# Patient Record
Sex: Female | Born: 1990 | Race: Black or African American | Hispanic: Refuse to answer | Marital: Single
Health system: Midwestern US, Community
[De-identification: ages and names within clinical notes are randomized; demographics above are authoritative.]

## PROBLEM LIST (undated history)

## (undated) ENCOUNTER — Inpatient Hospital Stay (HOSPITAL_COMMUNITY): Payer: Self-pay

## (undated) DIAGNOSIS — N83209 Unspecified ovarian cyst, unspecified side: Secondary | ICD-10-CM

## (undated) DIAGNOSIS — A749 Chlamydial infection, unspecified: Secondary | ICD-10-CM

## (undated) DIAGNOSIS — F419 Anxiety disorder, unspecified: Secondary | ICD-10-CM

## (undated) DIAGNOSIS — K219 Gastro-esophageal reflux disease without esophagitis: Secondary | ICD-10-CM

## (undated) HISTORY — DX: Chlamydial infection, unspecified: A74.9

## (undated) HISTORY — PX: DILATION AND CURETTAGE OF UTERUS: SHX78

## (undated) HISTORY — PX: INDUCED ABORTION: SHX677

## (undated) HISTORY — DX: Gastro-esophageal reflux disease without esophagitis: K21.9

---
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
## 2010-01-06 DIAGNOSIS — A749 Chlamydial infection, unspecified: Secondary | ICD-10-CM

## 2010-01-06 HISTORY — DX: Chlamydial infection, unspecified: A74.9

## 2010-01-15 ENCOUNTER — Emergency Department (HOSPITAL_COMMUNITY)
Admission: EM | Admit: 2010-01-15 | Discharge: 2010-01-15 | Payer: Self-pay | Source: Home / Self Care | Admitting: Family Medicine

## 2010-04-19 LAB — URINE CULTURE
Colony Count: 15000
Culture  Setup Time: 201112102038

## 2010-04-19 LAB — POCT URINALYSIS DIPSTICK
Bilirubin Urine: NEGATIVE
Glucose, UA: NEGATIVE mg/dL
Specific Gravity, Urine: >=1.03 (ref 1.005–1.030)

## 2010-04-19 LAB — GC/CHLAMYDIA PROBE AMP, GENITAL: Chlamydia, DNA Probe: POSITIVE — AB

## 2010-04-19 LAB — WET PREP, GENITAL

## 2010-04-28 ENCOUNTER — Emergency Department (HOSPITAL_COMMUNITY): Payer: Managed Care, Other (non HMO)

## 2010-04-28 ENCOUNTER — Emergency Department (HOSPITAL_COMMUNITY)
Admission: EM | Admit: 2010-04-28 | Discharge: 2010-04-28 | Disposition: A | Discharge status: Home / Self Care | Payer: Managed Care, Other (non HMO) | Attending: Emergency Medicine | Admitting: Emergency Medicine

## 2010-04-28 DIAGNOSIS — R0609 Other forms of dyspnea: Secondary | ICD-10-CM | POA: Insufficient documentation

## 2010-04-28 DIAGNOSIS — R0989 Other specified symptoms and signs involving the circulatory and respiratory systems: Secondary | ICD-10-CM | POA: Insufficient documentation

## 2010-04-28 DIAGNOSIS — R079 Chest pain, unspecified: Secondary | ICD-10-CM | POA: Insufficient documentation

## 2010-04-28 DIAGNOSIS — J45909 Unspecified asthma, uncomplicated: Secondary | ICD-10-CM | POA: Insufficient documentation

## 2010-04-28 LAB — URINALYSIS, ROUTINE W REFLEX MICROSCOPIC
Bilirubin Urine: NEGATIVE
Nitrite: NEGATIVE
Specific Gravity, Urine: 1.018 (ref 1.005–1.030)
Urobilinogen, UA: 1 mg/dL (ref 0.0–1.0)

## 2010-04-28 LAB — POCT I-STAT, CHEM 8
Glucose, Bld: 75 mg/dL (ref 70–99)
HCT: 44 % (ref 36.0–46.0)
Hemoglobin: 15 g/dL (ref 12.0–15.0)
Potassium: 3.9 mEq/L (ref 3.5–5.1)

## 2010-04-28 LAB — POCT PREGNANCY, URINE: Preg Test, Ur: NEGATIVE

## 2010-06-01 ENCOUNTER — Emergency Department (HOSPITAL_COMMUNITY): Admission: EM | Admit: 2010-06-01 | Payer: Managed Care, Other (non HMO) | Source: Home / Self Care

## 2010-06-01 ENCOUNTER — Inpatient Hospital Stay (INDEPENDENT_AMBULATORY_CARE_PROVIDER_SITE_OTHER)
Admission: RE | Admit: 2010-06-01 | Discharge: 2010-06-01 | Disposition: A | Discharge status: Home / Self Care | Payer: Managed Care, Other (non HMO) | Source: Ambulatory Visit | Attending: Family Medicine | Admitting: Family Medicine

## 2010-06-01 DIAGNOSIS — K219 Gastro-esophageal reflux disease without esophagitis: Secondary | ICD-10-CM

## 2010-08-26 ENCOUNTER — Encounter: Payer: Self-pay | Admitting: Internal Medicine

## 2010-09-19 ENCOUNTER — Ambulatory Visit: Payer: Managed Care, Other (non HMO) | Admitting: Internal Medicine

## 2010-09-19 ENCOUNTER — Telehealth: Payer: Self-pay | Admitting: Internal Medicine

## 2010-09-19 NOTE — Telephone Encounter (Signed)
No dont charge thanks 

## 2010-10-17 ENCOUNTER — Ambulatory Visit: Payer: Managed Care, Other (non HMO) | Admitting: Internal Medicine

## 2010-12-07 ENCOUNTER — Inpatient Hospital Stay (INDEPENDENT_AMBULATORY_CARE_PROVIDER_SITE_OTHER)
Admission: RE | Admit: 2010-12-07 | Discharge: 2010-12-07 | Disposition: A | Discharge status: Home / Self Care | Payer: Managed Care, Other (non HMO) | Source: Ambulatory Visit | Attending: Emergency Medicine | Admitting: Emergency Medicine

## 2010-12-07 DIAGNOSIS — N760 Acute vaginitis: Secondary | ICD-10-CM

## 2010-12-07 LAB — WET PREP, GENITAL

## 2010-12-07 LAB — POCT URINALYSIS DIP (DEVICE)
Ketones, ur: NEGATIVE mg/dL
Leukocytes, UA: NEGATIVE
Nitrite: NEGATIVE
Protein, ur: NEGATIVE mg/dL
Urobilinogen, UA: 1 mg/dL (ref 0.0–1.0)

## 2010-12-07 LAB — POCT PREGNANCY, URINE: Preg Test, Ur: NEGATIVE

## 2011-01-04 ENCOUNTER — Emergency Department (INDEPENDENT_AMBULATORY_CARE_PROVIDER_SITE_OTHER)
Admission: EM | Admit: 2011-01-04 | Discharge: 2011-01-04 | Disposition: A | Discharge status: Home / Self Care | Payer: Managed Care, Other (non HMO) | Source: Home / Self Care | Attending: Emergency Medicine | Admitting: Emergency Medicine

## 2011-01-04 ENCOUNTER — Encounter (HOSPITAL_COMMUNITY): Payer: Self-pay | Admitting: Emergency Medicine

## 2011-01-04 DIAGNOSIS — A6000 Herpesviral infection of urogenital system, unspecified: Secondary | ICD-10-CM

## 2011-01-04 LAB — WET PREP, GENITAL
Trich, Wet Prep: NONE SEEN
Yeast Wet Prep HPF POC: NONE SEEN

## 2011-01-04 MED ORDER — LIDOCAINE 4 % EX GEL: 1.0000 "application " | Freq: Four times a day (QID) | CUTANEOUS | Status: DC | PRN

## 2011-01-04 MED ORDER — ACYCLOVIR 400 MG PO TABS: 400.0000 mg | ORAL_TABLET | Freq: Three times a day (TID) | ORAL | Status: AC

## 2011-01-04 MED ORDER — LIDOCAINE VISCOUS 2 % MT SOLN
20.0000 mL | Freq: Once | OROMUCOSAL | Status: AC
  Administered 2011-01-04: 20 mL via OROMUCOSAL

## 2011-01-04 NOTE — Discharge Instructions (Signed)
Take the medication as written. Give [place] a working phone number so that we can contact you if needed. Refrain from sexual contact until you know your results and your partner(s) are treated. Return if you get worse, have a fever >100.4, or for any concerns.

## 2011-01-04 NOTE — ED Provider Notes (Signed)
History     CSN: [id number] Arrival date & time: 01/04/2011  1:39 PM   First MD Initiated Contact with Patient 01/04/11 1309      Chief Complaint  Patient presents with  . Abdominal Pain    HPI Comments: Pt with labial itching, pain x 2 days. Has dysuria.  No urgency, frequency, oderous urine, hematuria, known genital blisters, vaginal itching. No known d/c. Just finished menses. Reports intermittent lwer midline abd pain c/w menstrual cramps. Taking motrin for abd pain with some relief.  No fevers, N/V,  back pain. Pt seen here with vag d/, abd pain c on 10/31 and empirically tx'd for gonorrhea, chlamydia and just finished flagyl for BV. GC chlaymdia were neg on that visit. Pt sexually active with same female partner who is asxatic. Recently had intercouse with same partner again did not use protection. STD's not a concern today.  No h/o syphilis, herpes, HIV.   Patient is a 20 y.o. female presenting with vaginal itching. The history is provided by the patient.  Vaginal Itching This is a new problem. The current episode started 2 days ago. The problem occurs constantly. Pertinent negatives include no chest pain and no abdominal pain. The symptoms are relieved by nothing. She has tried nothing for the symptoms.    Past Medical History  Diagnosis Date  . Asthma   . GERD (gastroesophageal reflux disease)   . Chlamydia 01/2010  . BV (bacterial vaginosis)     History reviewed. No pertinent past surgical history.  History reviewed. No pertinent family history.  History  Substance Use Topics  . Smoking status: Never Smoker   . Smokeless tobacco: Not on file  . Alcohol Use: No    OB History    Grav Para Term Preterm Abortions TAB SAB Ect Mult Living                  Review of Systems  Constitutional: Negative for fever.  HENT: Negative for sore throat and mouth sores.   Respiratory: Negative for cough.   Cardiovascular: Negative for chest pain.  Gastrointestinal: Negative  for abdominal pain.  Genitourinary: Positive for vaginal bleeding, vaginal discharge and vaginal pain. Negative for dysuria, urgency, frequency, hematuria and flank pain.  Musculoskeletal: Negative for back pain.  Skin: Negative for rash.    Allergies  Review of patient's allergies indicates no known allergies.  Home Medications   Current Outpatient Rx  Name Route Sig Dispense Refill  . ACYCLOVIR 400 MG PO TABS Oral Take 1 tablet (400 mg total) by mouth 3 (three) times daily. 30 tablet 0  . LIDOCAINE 4 % EX GEL Apply externally Apply 1 application topically 4 (four) times daily as needed. Do not exceed 5 gm 10 g 0    BP 109/68  Pulse 78  Temp(Src) 100 F (37.8 C) (Oral)  Resp 18  SpO2 100%  LMP 01/01/2011  Physical Exam  Nursing note and vitals reviewed. Constitutional: She is oriented to person, place, and time. She appears well-developed and well-nourished. No distress.  HENT:  Head: Normocephalic and atraumatic.  Eyes: EOM are normal. Pupils are equal, round, and reactive to light.  Neck: Normal range of motion. Neck supple.  Cardiovascular: Normal rate, regular rhythm and normal heart sounds.   Pulmonary/Chest: Effort normal and breath sounds normal.  Abdominal: Soft. Bowel sounds are normal. She exhibits no distension. There is no tenderness. There is no rebound and no guarding.  Genitourinary: Uterus normal. Pelvic exam was performed with patient  prone. There is rash on the right labia. There is no rash on the left labia. Uterus is not tender. Cervix exhibits discharge. Cervix exhibits no motion tenderness and no friability. Right adnexum displays no mass, no tenderness and no fullness. Left adnexum displays no mass, no tenderness and no fullness. No erythema around the vagina. No foreign body around the vagina.       Painful vesicular rash right labia. painful vesicular lesions on vaginal walls. Cervix normal. Scant bleeding from os. No discharge noted. Chaperone present  during exam.  Musculoskeletal: Normal range of motion.  Neurological: She is alert and oriented to person, place, and time.  Skin: Skin is warm and dry.  Psychiatric: She has a normal mood and affect. Her behavior is normal. Judgment and thought content normal.    ED Course  Procedures (including critical care time)  Labs Reviewed  WET PREP, GENITAL - Abnormal; Notable for the following:    Clue Cells, Wet Prep FEW (*)    WBC, Wet Prep HPF POC MANY (*)    All other components within normal limits  POCT PREGNANCY, URINE  GC/CHLAMYDIA PROBE AMP, GENITAL  HERPES SIMPLEX VIRUS CULTURE   No results found.   1. Herpes genitalis    udip not crossing overL Tr ketone,mod  blood, small leuk. Pt with vaginal bleeding  Results for orders placed during the hospital encounter of 01/04/11  WET PREP, GENITAL      Component Value Range   Yeast, Wet Prep NONE SEEN  NONE SEEN    Trich, Wet Prep NONE SEEN  NONE SEEN    Clue Cells, Wet Prep FEW (*) NONE SEEN    WBC, Wet Prep HPF POC MANY (*) NONE SEEN   POCT PREGNANCY, URINE      Component Value Range   Preg Test, Ur NEGATIVE       MDM  H&P most c/w herpes. Sent off GC/chlamydia, wet prep HSV culture. Will not treat empirically now. Will send home with acyclovir.  Advised pt to refrain from sexual contact until she knows lab results, symptoms resolve, and partner(s) are treated. Pt provided working phone number. Advised to call here in 5 days if pt does not hear from [place]. Pt agrees.     Luiz Blare, MD 01/04/11 [phone]

## 2011-01-05 ENCOUNTER — Encounter (HOSPITAL_COMMUNITY): Payer: Self-pay | Admitting: Emergency Medicine

## 2011-01-05 ENCOUNTER — Emergency Department (HOSPITAL_COMMUNITY)
Admission: EM | Admit: 2011-01-05 | Discharge: 2011-01-06 | Disposition: A | Discharge status: Home / Self Care | Payer: Managed Care, Other (non HMO) | Attending: Emergency Medicine | Admitting: Emergency Medicine

## 2011-01-05 DIAGNOSIS — A6000 Herpesviral infection of urogenital system, unspecified: Secondary | ICD-10-CM | POA: Insufficient documentation

## 2011-01-05 DIAGNOSIS — N949 Unspecified condition associated with female genital organs and menstrual cycle: Secondary | ICD-10-CM | POA: Insufficient documentation

## 2011-01-05 DIAGNOSIS — J45909 Unspecified asthma, uncomplicated: Secondary | ICD-10-CM | POA: Insufficient documentation

## 2011-01-05 DIAGNOSIS — K219 Gastro-esophageal reflux disease without esophagitis: Secondary | ICD-10-CM | POA: Insufficient documentation

## 2011-01-05 DIAGNOSIS — A6009 Herpesviral infection of other urogenital tract: Secondary | ICD-10-CM

## 2011-01-05 LAB — GC/CHLAMYDIA PROBE AMP, GENITAL: GC Probe Amp, Genital: NEGATIVE

## 2011-01-05 NOTE — ED Notes (Signed)
PT. REPORTS VAGINAL PAIN " BLISTERS" ONSET 2 DAYS AGO , DENIES VAGINAL DISCHARGE ,  NO FEVER OR CHILLS .

## 2011-01-06 MED ORDER — HYDROCODONE-ACETAMINOPHEN 5-325 MG PO TABS: ORAL_TABLET | ORAL | Status: DC

## 2011-01-06 MED ORDER — HYDROCODONE-ACETAMINOPHEN 5-325 MG PO TABS
1.0000 | ORAL_TABLET | Freq: Once | ORAL | Status: AC
  Administered 2011-01-06: 1 via ORAL
  Filled 2011-01-06: qty 1

## 2011-01-06 NOTE — ED Provider Notes (Signed)
History     CSN: [id number] Arrival date & time: 01/05/2011  6:50 PM   First MD Initiated Contact with Patient 01/05/11 2348      Chief Complaint  Patient presents with  . Vaginal Pain    (Consider location/radiation/quality/duration/timing/severity/associated sxs/prior treatment) Patient is a 20 y.o. female presenting with vaginal pain. The history is provided by the patient.  Vaginal Pain This is a new problem. The current episode started yesterday. The problem occurs constantly. The problem has been unchanged. Pertinent negatives include no chills or fever. Associated symptoms comments: She complains of painful vaginal blisters x 2 days. She was seen at Urgent care 2 days ago and given Acyclovir and topical lidocaine for pain. She is here for further pain management. No new symptoms..    Past Medical History  Diagnosis Date  . Asthma   . GERD (gastroesophageal reflux disease)   . Chlamydia 01/2010  . BV (bacterial vaginosis)     History reviewed. No pertinent past surgical history.  No family history on file.  History  Substance Use Topics  . Smoking status: Never Smoker   . Smokeless tobacco: Not on file  . Alcohol Use: No    OB History    Grav Para Term Preterm Abortions TAB SAB Ect Mult Living                  Review of Systems  Constitutional: Negative for fever and chills.  Gastrointestinal: Negative.   Genitourinary: Positive for vaginal pain. Negative for vaginal discharge.  Musculoskeletal: Negative.   Skin: Negative.   Neurological: Negative.     Allergies  Review of patient's allergies indicates no known allergies.  Home Medications   Current Outpatient Rx  Name Route Sig Dispense Refill  . ACYCLOVIR 400 MG PO TABS Oral Take 1 tablet (400 mg total) by mouth 3 (three) times daily. 30 tablet 0  . IBUPROFEN 200 MG PO TABS Oral Take 400 mg by mouth every 6 (six) hours as needed. For  pain     . LIDOCAINE 4 % EX GEL Apply externally Apply 1  application topically 4 (four) times daily as needed. Do not exceed 5 gm; patient unable to find at any pharmacy in [place], so only has used what the Dr. Jovita Gamma at office.       BP 109/70  Pulse 93  Temp(Src) 99.1 F (37.3 C) (Oral)  Resp 16  SpO2 100%  LMP 01/01/2011  Physical Exam  Constitutional: She is oriented to person, place, and time. She appears well-developed and well-nourished.  Neck: Normal range of motion.  Pulmonary/Chest: Effort normal.  Abdominal: Soft. There is no tenderness.  Genitourinary:       Multiple vaginal blisters to external labia as well as vulva c/w herpetic infection.   Neurological: She is alert and oriented to person, place, and time.  Skin: Skin is warm and dry.    ED Course  Procedures (including critical care time)  Labs Reviewed - No data to display No results found.   No diagnosis found.    MDM          Rodena Medin, PA 01/06/11 [phone]

## 2011-01-06 NOTE — ED Provider Notes (Signed)
Medical screening examination/treatment/procedure(s) were performed by non-physician practitioner and as supervising physician I was immediately available for consultation/collaboration.   Kasmira Cacioppo M Analysa Nutting, MD 01/06/11 0904 

## 2011-01-09 ENCOUNTER — Telehealth (HOSPITAL_COMMUNITY): Payer: Self-pay | Admitting: *Deleted

## 2011-01-09 NOTE — ED Notes (Signed)
1731  Discussed with Dr. Chaney Malling and order obtained for Flagyl 500 mg. PO BID x 7 days # 14  no refills if pt. has discharge or sx.'s of BV. Vassie Moselle 01/09/2011

## 2011-01-12 ENCOUNTER — Telehealth (HOSPITAL_COMMUNITY): Payer: Self-pay | Admitting: *Deleted

## 2011-01-12 NOTE — ED Notes (Signed)
12/3 1731 Written order obtained from Dr. Chaney Malling for Flagyl 500 mg. PO BID x 7 days # 14 no refills.  She said tx. pt. if symptomatic.  I called pt. and she said no symptoms of discharge, just tingling in perineum.  I told her if she has a discharge after she has finished to the Acyclovir to call back. 12/5 Pt. called on voicemail @ 1401 and said she needs the medicine. I called back at 1453 and left a message to call. 12/6 I called pt. back and pt. states she is having a yellowish discharge with an odor and had to wear a pad today. Pt. wants Rx. called to [facility]. Rx. called to pharmacist @ [phone]. Angela Riley 01/12/2011

## 2011-01-17 ENCOUNTER — Telehealth (HOSPITAL_COMMUNITY): Payer: Self-pay | Admitting: *Deleted

## 2011-02-18 ENCOUNTER — Encounter (HOSPITAL_COMMUNITY): Payer: Self-pay | Admitting: *Deleted

## 2011-02-18 ENCOUNTER — Emergency Department (INDEPENDENT_AMBULATORY_CARE_PROVIDER_SITE_OTHER)
Admission: EM | Admit: 2011-02-18 | Discharge: 2011-02-18 | Disposition: A | Discharge status: Home / Self Care | Payer: Managed Care, Other (non HMO) | Source: Home / Self Care | Attending: Family Medicine | Admitting: Family Medicine

## 2011-02-18 DIAGNOSIS — A6000 Herpesviral infection of urogenital system, unspecified: Secondary | ICD-10-CM

## 2011-02-18 DIAGNOSIS — N139 Obstructive and reflux uropathy, unspecified: Secondary | ICD-10-CM

## 2011-02-18 LAB — POCT URINALYSIS DIP (DEVICE)
Hgb urine dipstick: NEGATIVE
Nitrite: NEGATIVE
Protein, ur: NEGATIVE mg/dL
pH: 6 (ref 5.0–8.0)

## 2011-02-18 MED ORDER — ACYCLOVIR 400 MG PO TABS: 400.0000 mg | ORAL_TABLET | Freq: Four times a day (QID) | ORAL | Status: AC

## 2011-02-18 MED ORDER — CEPHALEXIN 500 MG PO CAPS: 500.0000 mg | ORAL_CAPSULE | Freq: Four times a day (QID) | ORAL | Status: AC

## 2011-02-18 NOTE — ED Notes (Signed)
Pt with symptoms of vaginal discharge/urinary frequency same symptoms seen in December - pt states she took all of the medications until gone - had improved now symptoms returned

## 2011-02-18 NOTE — ED Provider Notes (Signed)
History     CSN: [id number]  Arrival date & time 02/18/11  1825   First MD Initiated Contact with Patient 02/18/11 1826      Chief Complaint  Patient presents with  . Urinary Frequency  . Vaginal Discharge    (Consider location/radiation/quality/duration/timing/severity/associated sxs/prior treatment) Patient is a 21 y.o. female presenting with frequency. The history is provided by the patient.  Urinary Frequency This is a recurrent (seen 11/28 for genital herpes feels like bump starting again and urinary sx also.) problem. The problem has been gradually worsening. Pertinent negatives include no abdominal pain.    Past Medical History  Diagnosis Date  . Asthma   . GERD (gastroesophageal reflux disease)   . Chlamydia 01/2010  . BV (bacterial vaginosis)     History reviewed. No pertinent past surgical history.  History reviewed. No pertinent family history.  History  Substance Use Topics  . Smoking status: Never Smoker   . Smokeless tobacco: Not on file  . Alcohol Use: No    OB History    Grav Para Term Preterm Abortions TAB SAB Ect Mult Living                  Review of Systems  Constitutional: Negative.   Gastrointestinal: Negative.  Negative for abdominal pain.  Genitourinary: Positive for dysuria, frequency and vaginal pain. Negative for vaginal bleeding and vaginal discharge.    Allergies  Review of patient's allergies indicates no known allergies.  Home Medications   Current Outpatient Rx  Name Route Sig Dispense Refill  . IBUPROFEN 200 MG PO TABS Oral Take 400 mg by mouth every 6 (six) hours as needed. For  pain    . ACYCLOVIR 400 MG PO TABS Oral Take 1 tablet (400 mg total) by mouth 4 (four) times daily. For 5 days,  Repeat as needed as soon as recurrent blisters develop. 50 tablet 0  . CEPHALEXIN 500 MG PO CAPS Oral Take 1 capsule (500 mg total) by mouth 4 (four) times daily. Take all of medicine and drink lots of fluids 20 capsule 0  .  HYDROCODONE-ACETAMINOPHEN 5-325 MG PO TABS  One to two tablets q 4-6 hours prn 15 tablet 0  . LIDOCAINE 4 % EX GEL Apply externally Apply 1 application topically 4 (four) times daily as needed. Do not exceed 5 gm; patient unable to find at any pharmacy in [place], so only has used what the Dr. Jovita Gamma at office.       BP 121/70  Pulse 75  Temp(Src) 98.8 F (37.1 C) (Oral)  Resp 16  SpO2 100%  LMP 01/16/2011  Physical Exam  Nursing note and vitals reviewed. Constitutional: She is oriented to person, place, and time. She appears well-developed and well-nourished.  Abdominal: Soft. Bowel sounds are normal. She exhibits no distension and no mass. There is no tenderness. There is no rebound and no guarding.  Neurological: She is alert and oriented to person, place, and time.    ED Course  Procedures (including critical care time)  Labs Reviewed  POCT URINALYSIS DIP (DEVICE) - Abnormal; Notable for the following:    Leukocytes, UA MODERATE (*) Biochemical Testing Only. Please order routine urinalysis from main lab if confirmatory testing is needed.   All other components within normal limits  POCT PREGNANCY, URINE  POCT URINALYSIS DIPSTICK  POCT PREGNANCY, URINE   No results found.   1. Urinary (tract) obstruction   2. Recurrent genital herpes       MDM  Barkley Bruns, MD 02/18/11 1946

## 2011-06-25 ENCOUNTER — Encounter (HOSPITAL_COMMUNITY): Payer: Self-pay | Admitting: Obstetrics and Gynecology

## 2011-06-25 ENCOUNTER — Inpatient Hospital Stay (HOSPITAL_COMMUNITY): Payer: Managed Care, Other (non HMO)

## 2011-06-25 ENCOUNTER — Inpatient Hospital Stay (HOSPITAL_COMMUNITY)
Admission: AD | Admit: 2011-06-25 | Discharge: 2011-06-25 | Disposition: A | Discharge status: Home / Self Care | Payer: Managed Care, Other (non HMO) | Source: Ambulatory Visit | Attending: Obstetrics and Gynecology | Admitting: Obstetrics and Gynecology

## 2011-06-25 DIAGNOSIS — N390 Urinary tract infection, site not specified: Secondary | ICD-10-CM | POA: Insufficient documentation

## 2011-06-25 DIAGNOSIS — O2390 Unspecified genitourinary tract infection in pregnancy, unspecified trimester: Secondary | ICD-10-CM | POA: Insufficient documentation

## 2011-06-25 DIAGNOSIS — N739 Female pelvic inflammatory disease, unspecified: Secondary | ICD-10-CM | POA: Insufficient documentation

## 2011-06-25 DIAGNOSIS — O98319 Other infections with a predominantly sexual mode of transmission complicating pregnancy, unspecified trimester: Secondary | ICD-10-CM | POA: Insufficient documentation

## 2011-06-25 DIAGNOSIS — R109 Unspecified abdominal pain: Secondary | ICD-10-CM | POA: Insufficient documentation

## 2011-06-25 DIAGNOSIS — A5619 Other chlamydial genitourinary infection: Secondary | ICD-10-CM | POA: Insufficient documentation

## 2011-06-25 DIAGNOSIS — Z3200 Encounter for pregnancy test, result unknown: Secondary | ICD-10-CM

## 2011-06-25 LAB — URINALYSIS, ROUTINE W REFLEX MICROSCOPIC
Bilirubin Urine: NEGATIVE
Glucose, UA: NEGATIVE mg/dL
Protein, ur: NEGATIVE mg/dL
Specific Gravity, Urine: >1.03 — ABNORMAL HIGH (ref 1.005–1.030)
Urobilinogen, UA: 1 mg/dL (ref 0.0–1.0)

## 2011-06-25 LAB — URINE MICROSCOPIC-ADD ON

## 2011-06-25 LAB — HCG, QUANTITATIVE, PREGNANCY: hCG, Beta Chain, Quant, S: 961 m[IU]/mL — ABNORMAL HIGH (ref <5)

## 2011-06-25 LAB — POCT PREGNANCY, URINE: Preg Test, Ur: POSITIVE — AB

## 2011-06-25 NOTE — MAU Provider Note (Signed)
Chief Complaint:  Abdominal Pain    First Provider Initiated Contact with Patient 06/25/11 1210      Angela Riley is  21 y.o. [id number].  Patient's last menstrual period was 05/09/2011.Marland Kitchen  Her pregnancy status is positive.  [redacted]w[redacted]d She presents complaining of Abdominal Pain . Onset is described as intermittent and has been present for  1-2 days. Reports being treated for + chlamydia, yeast, and UTI last week at Urgent Care in [place]. Pharmacy confirmed Rx for Zithromax and Diflucan.  Obstetrical/Gynecological History: OB History    Grav Para Term Preterm Abortions TAB SAB Ect Mult Living   3 0 0 0 2 2 0 0 0 0       Past Medical History: Past Medical History  Diagnosis Date  . Asthma   . GERD (gastroesophageal reflux disease)   . Chlamydia 01/2010  . BV (bacterial vaginosis)     Past Surgical History: History reviewed. No pertinent past surgical history.  Family History: History reviewed. No pertinent family history.  Social History: History  Substance Use Topics  . Smoking status: Never Smoker   . Smokeless tobacco: Not on file  . Alcohol Use: No    Allergies: No Known Allergies  Prescriptions prior to admission  Medication Sig Dispense Refill  . azithromycin (ZITHROMAX) 500 MG tablet Take 1,000 mg by mouth daily.      . fluconazole (DIFLUCAN) 150 MG tablet Take 150 mg by mouth once. Patient was to take one weekly for 2 weeks      . ibuprofen (ADVIL,MOTRIN) 200 MG tablet Take 400 mg by mouth every 6 (six) hours as needed. For  pain      . Sulfamethoxazole-Trimethoprim (BACTRIM DS PO) Take 1 tablet by mouth 2 (two) times daily.        Review of Systems - Negative except what has what has been reviewed in HPI  Physical Exam   Blood pressure 127/68, pulse 96, temperature 98.3 F (36.8 C), temperature source Oral, resp. rate 16, height 5\' 6"  (1.676 m), weight 136 lb 9.6 oz (61.961 kg), last menstrual period 05/09/2011.  General: General appearance - alert,  well appearing, and in no distress and oriented to person, place, and time Mental status - alert, oriented to person, place, and time, normal mood, behavior, speech, dress, motor activity, and thought processes Abdomen - soft, nontender, nondistended, no masses or organomegaly Focused Gynecological Exam: examination not indicated  Labs: Recent Results (from the past 24 hour(s))  URINALYSIS, ROUTINE W REFLEX MICROSCOPIC   Collection Time   06/25/11 10:55 AM      Component Value Range   Color, Urine YELLOW  YELLOW    APPearance CLEAR  CLEAR    Specific Gravity, Urine >1.030 (*) 1.005 - 1.030    pH 6.0  5.0 - 8.0    Glucose, UA NEGATIVE  NEGATIVE (mg/dL)   Hgb urine dipstick TRACE (*) NEGATIVE    Bilirubin Urine NEGATIVE  NEGATIVE    Ketones, ur NEGATIVE  NEGATIVE (mg/dL)   Protein, ur NEGATIVE  NEGATIVE (mg/dL)   Urobilinogen, UA 1.0  0.0 - 1.0 (mg/dL)   Nitrite NEGATIVE  NEGATIVE    Leukocytes, UA SMALL (*) NEGATIVE   URINE MICROSCOPIC-ADD ON   Collection Time   06/25/11 10:55 AM      Component Value Range   Squamous Epithelial / LPF FEW (*) RARE    WBC, UA 7-10  <3 (WBC/hpf)   RBC / HPF 3-6  <3 (RBC/hpf)   Bacteria, UA FEW (*)  RARE   POCT PREGNANCY, URINE   Collection Time   06/25/11 11:32 AM      Component Value Range   Preg Test, Ur POSITIVE (*) NEGATIVE   HCG, QUANTITATIVE, PREGNANCY   Collection Time   06/25/11 12:34 PM      Component Value Range   hCG, Beta Chain, Quant, S 961 (*) <5 (mIU/mL)  ABO/RH   Collection Time   06/25/11 12:34 PM      Component Value Range   ABO/RH(D) O POS     Imaging Studies:  Attempted informal bedside US. Unable to visualize IUP. Will proceed with OB complete <14 weeks in radiology.  Clinical Data: 21 year old female with positive home pregnancy test  and pelvic pain/cramping.  OBSTETRIC <14 WK [place] AND TRANSVAGINAL OB US  Technique: Both transabdominal and transvaginal ultrasound  examinations were performed for complete evaluation of  the  gestation as well as the maternal uterus, adnexal regions, and  pelvic cul-de-sac. Transvaginal technique was performed to assess  early pregnancy.  Comparison: None  Intrauterine gestational sac: Not visualized  Yolk sac: Not visualized  Embryo: Not visualized  Maternal uterus/adnexae:  The ovaries bilaterally are normal in appearance and size.  The uterus is anteverted.  The endometrial stripe is unremarkable measuring 11 mm in  thickness.  A trace amount of free fluid within the pelvis is noted.  There is no evidence of adnexal mass.  IMPRESSION:  Trace amount of free pelvic fluid, which may be physiologic.  Otherwise unremarkable pelvic ultrasound.  If the patient has a positive pregnancy test, then these findings  may represent a normal early or failed first trimester pregnancy.  Follow-up is recommended as ectopic pregnancy is difficult to  entirely exclude, although no secondary signs are identified.    Assessment: Positive Pregnancy Test S/p treatment for + chlamydia and UTI  Plan: Discharge home FU MAU in 48 hours for repeat quant  Octavius Shin E. 06/25/2011,1:50 PM

## 2011-06-25 NOTE — Discharge Instructions (Signed)
Human Chorionic Gonadotropin (hCG) This is a test to confirm and monitor pregnancy or to diagnose trophoblastic disease or germ cell tumors. As early as 10 days after a missed menstrual period (some methods can detect hCG even earlier, at one week after conception) or if your caregiver thinks that your symptoms suggest ectopic pregnancy, a failing pregnancy, trophoblastic disease, or germ cell tumors. hCG is a protein produced in the placenta of a pregnant woman. A pregnancy test is a specific blood or urine test that can detect hCG and confirm pregnancy. This hormone is able to be detected 10 days after a missed menstrual period, the time period when the fertilized egg is implanted in the woman's uterus. With some methods, hCG can be detected even earlier, at one week after conception.  During the early weeks of pregnancy, hCG is important in maintaining function of the corpus luteum (the mass of cells that forms from a mature egg). Production of hCG increases steadily during the first trimester (8-10 weeks), peaking around the 10th week after the last menstrual cycle. Levels then fall slowly during the remainder of the pregnancy. hCG is no longer detectable within a few weeks after delivery. hCG is also produced by some germ cell tumors and increased levels are seen in trophoblastic disease. SAMPLE COLLECTION hCG is commonly detected in urine. The preferred specimen is a random urine sample collected first thing in the morning. hCG can also be measured in blood drawn from a vein in the arm. NORMAL FINDINGS Qualitative:negative in non-pregnant women; positive in pregnancy Quantitative:   Gestation less than 1 week: 5-50 Whole HCG (milli-international units/mL)   Gestation of 2 weeks: 50-500 Whole HCG (milli-international units/mL)   Gestation of 3 weeks: 100-10,000 Whole HCG (milli-international units/mL)   Gestation of 4 weeks: 1,000-30,000 Whole HCG (milli-international units/mL)   Gestation of  5 weeks 3,500-115,000 Whole HCG (milli-international units/mL)   Gestation of 6-8 weeks: 12,000-270,000 Whole HCG (milli-international units/mL)   Gestation of 12 weeks: 15,000-220,000 Whole HCG (milli-international units/mL)   Males and non-pregnant females: less than 5 Whole HCG (milli-international units/mL)  Beta subunit: depends on the method and test used Ranges for normal findings may vary among different laboratories and hospitals. You should always check with your doctor after having lab work or other tests done to discuss the meaning of your test results and whether your values are considered within normal limits. MEANING OF TEST  Your caregiver will go over the test results with you and discuss the importance and meaning of your results, as well as treatment options and the need for additional tests if necessary. OBTAINING THE TEST RESULTS It is your responsibility to obtain your test results. Ask the lab or department performing the test when and how you will get your results. Document Released: 02/25/2004 Document Revised: 01/12/2011 Document Reviewed: 01/04/2008 [facility] Patient Information 2012 [place], [place].Abdominal Pain During Pregnancy Abdominal discomfort is common in pregnancy. Most of the time, it does not cause harm. There are many causes of abdominal pain. Some causes are more serious than others. Some of the causes of abdominal pain in pregnancy are easily diagnosed. Occasionally, the diagnosis takes time to understand. Other times, the cause is not determined. Abdominal pain can be a sign that something is very wrong with the pregnancy, or the pain may have nothing to do with the pregnancy at all. For this reason, always tell your caregiver if you have any abdominal discomfort. CAUSES Common and harmless causes of abdominal pain include:  Constipation.  Excess gas and bloating.   Round ligament pain. This is pain that is felt in the folds of the groin.   The  position the baby or placenta is in.   Baby kicks.   Braxton-Hicks contractions. These are mild contractions that do not cause cervical dilation.  Serious causes of abdominal pain include:  Ectopic pregnancy. This happens when a fertilized egg implants outside of the uterus.   Miscarriage.   Preterm labor. This is when labor starts at less than 37 weeks of pregnancy.   Placental abruption. This is when the placenta partially or completely separates from the uterus.   Preeclampsia. This is often associated with high blood pressure and has been referred to as "toxemia in pregnancy."   Uterine or amniotic fluid infections.  Causes unrelated to pregnancy include:  Urinary tract infection.   Gallbladder stones or inflammation.   Hepatitis or other liver illness.   Intestinal problems, stomach flu, food poisoning, or ulcer.   Appendicitis.   Kidney (renal) stones.   Kidney infection (pylonephritis).  HOME CARE INSTRUCTIONS  For mild pain:  Do not have sexual intercourse or put anything in your vagina until your symptoms go away completely.   Get plenty of rest until your pain improves. If your pain does not improve in 1 hour, call your caregiver.   Drink clear fluids if you feel nauseous. Avoid solid food as long as you are uncomfortable or nauseous.   Only take medicine as directed by your caregiver.   Keep all follow-up appointments with your caregiver.  SEEK IMMEDIATE MEDICAL CARE IF:  You are bleeding, leaking fluid, or passing tissue from the vagina.   You have increasing pain or cramping.   You have persistent vomiting.   You have painful or bloody urination.   You have a fever.   You notice a decrease in your baby's movements.   You have extreme weakness or feel faint.   You have shortness of breath, with or without abdominal pain.   You develop a severe headache with abdominal pain.   You have abnormal vaginal discharge with abdominal pain.   You  have persistent diarrhea.   You have abdominal pain that continues even after rest, or gets worse.  MAKE SURE YOU:   Understand these instructions.   Will watch your condition.   Will get help right away if you are not doing well or get worse.  Document Released: 01/23/2005 Document Revised: 01/12/2011 Document Reviewed: 08/19/2010 [facility] Patient Information 2012 [place], [place].

## 2011-06-25 NOTE — MAU Note (Signed)
Having bad stomach cramps for 4 to 5 days feeling sick, positive home pregnancy test yesterday, last period sometime beginning of April

## 2011-06-26 NOTE — MAU Provider Note (Signed)
Agree with above note.  Hallelujah Wysong 06/26/2011 6:14 AM

## 2011-06-27 ENCOUNTER — Inpatient Hospital Stay (HOSPITAL_COMMUNITY)
Admission: AD | Admit: 2011-06-27 | Discharge: 2011-06-27 | Disposition: A | Discharge status: Home / Self Care | Payer: Managed Care, Other (non HMO) | Source: Ambulatory Visit | Attending: Family Medicine | Admitting: Family Medicine

## 2011-06-27 DIAGNOSIS — R109 Unspecified abdominal pain: Secondary | ICD-10-CM | POA: Insufficient documentation

## 2011-06-27 DIAGNOSIS — O99891 Other specified diseases and conditions complicating pregnancy: Secondary | ICD-10-CM | POA: Insufficient documentation

## 2011-06-27 DIAGNOSIS — R1084 Generalized abdominal pain: Secondary | ICD-10-CM

## 2011-06-27 NOTE — MAU Provider Note (Signed)
  History     CSN: [id number]  Arrival date and time: 06/27/11 1316   None     Chief Complaint  Patient presents with  . Follow-up   HPI Pt is [redacted] weeks pregnant by LMP of 05/09/2011 for f/u HCG.  Pt was seen on 06/25/2011 with abdominal pain.  Her HCG at that time was 961 and ultrasound showed no YS or embryo.  Her blood type is O+.  Today she denies vaginal bleeding but has some cramping.   Past Medical History  Diagnosis Date  . Asthma   . GERD (gastroesophageal reflux disease)   . Chlamydia 01/2010  . BV (bacterial vaginosis)     No past surgical history on file.  No family history on file.  History  Substance Use Topics  . Smoking status: Never Smoker   . Smokeless tobacco: Not on file  . Alcohol Use: No    Allergies: No Known Allergies  Prescriptions prior to admission  Medication Sig Dispense Refill  . azithromycin (ZITHROMAX) 500 MG tablet Take 1,000 mg by mouth daily.      . fluconazole (DIFLUCAN) 150 MG tablet Take 150 mg by mouth once. Patient was to take one weekly for 2 weeks      . ibuprofen (ADVIL,MOTRIN) 200 MG tablet Take 400 mg by mouth every 6 (six) hours as needed. For  pain      . Sulfamethoxazole-Trimethoprim (BACTRIM DS PO) Take 1 tablet by mouth 2 (two) times daily.        ROS Physical Exam   Blood pressure 102/60, pulse 76, temperature 97.9 F (36.6 C), temperature source Oral, resp. rate 16, last menstrual period 05/19/2011, SpO2 100.00%.  Physical Exam  MAU Course  Procedures Results for orders placed during the hospital encounter of 06/27/11 (from the past 24 hour(s))  HCG, QUANTITATIVE, PREGNANCY     Status: Abnormal   Collection Time   06/27/11  1:34 PM      Component Value Range   hCG, Beta Chain, Quant, S 1870 (*) <5 (mIU/mL)  discussed with Dr. Shawnie Pons- will return in 48 hours for repeat HCG  Assessment and Plan  Abdominal pain in early pregnancy F/u 06/29/2011 for repeat HCG  Angela Riley 06/27/2011, 2:14 PM

## 2011-06-27 NOTE — MAU Note (Signed)
Patient to MAU for repeat BHCG. Denies any bleeding but does having cramping at night.

## 2011-06-27 NOTE — MAU Provider Note (Signed)
Chart reviewed and agree with management and plan.  

## 2011-07-03 ENCOUNTER — Inpatient Hospital Stay (HOSPITAL_COMMUNITY)
Admission: AD | Admit: 2011-07-03 | Discharge: 2011-07-03 | Disposition: A | Discharge status: Home / Self Care | Payer: Managed Care, Other (non HMO) | Source: Ambulatory Visit | Attending: Obstetrics & Gynecology | Admitting: Obstetrics & Gynecology

## 2011-07-03 ENCOUNTER — Encounter (HOSPITAL_COMMUNITY): Payer: Self-pay

## 2011-07-03 ENCOUNTER — Inpatient Hospital Stay (HOSPITAL_COMMUNITY): Payer: Managed Care, Other (non HMO)

## 2011-07-03 DIAGNOSIS — O99891 Other specified diseases and conditions complicating pregnancy: Secondary | ICD-10-CM | POA: Insufficient documentation

## 2011-07-03 DIAGNOSIS — Z3490 Encounter for supervision of normal pregnancy, unspecified, unspecified trimester: Secondary | ICD-10-CM

## 2011-07-03 HISTORY — DX: Unspecified ovarian cyst, unspecified side: N83.209

## 2011-07-03 HISTORY — DX: Anxiety disorder, unspecified: F41.9

## 2011-07-03 LAB — HCG, QUANTITATIVE, PREGNANCY: hCG, Beta Chain, Quant, S: 5919 m[IU]/mL — ABNORMAL HIGH (ref <5)

## 2011-07-03 NOTE — MAU Provider Note (Signed)
History     CSN: [id number]  Arrival date and time: 07/03/11 1122   None     No chief complaint on file.  HPI Angela Riley is 21 y.o. G3P0020 [redacted]w[redacted]d weeks presenting for follow up BHCG>  She was initially seen 5/19--for abdominal pain in early pregnancy.  BHCG 961, blood type O+, and U/s did not see IUGS.  She returned on 5/21  BHCG 1870.  Denies bleeding or pain today.    Past Medical History  Diagnosis Date  . Asthma   . GERD (gastroesophageal reflux disease)   . Chlamydia 01/2010  . BV (bacterial vaginosis)     No past surgical history on file.  No family history on file.  History  Substance Use Topics  . Smoking status: Never Smoker   . Smokeless tobacco: Not on file  . Alcohol Use: No    Allergies: No Known Allergies  Prescriptions prior to admission  Medication Sig Dispense Refill  . azithromycin (ZITHROMAX) 500 MG tablet Take 1,000 mg by mouth daily. Pt started her medication on 06/17/11. She says that she has two pills left.      . fluconazole (DIFLUCAN) 150 MG tablet Take 150 mg by mouth once. Patient was to take one weekly for 2 weeks.    06/27/11- pt states that she started this medication on 06/17/11, but she never took her second dose.      . ibuprofen (ADVIL,MOTRIN) 200 MG tablet Take 400 mg by mouth every 6 (six) hours as needed. For  pain      . Sulfamethoxazole-Trimethoprim (BACTRIM DS PO) Take 1 tablet by mouth 2 (two) times daily. Pt also started this medication on 06/17/11, but she stopped taking because she found out that she might be pregnant.        Review of Systems  Constitutional: Negative for fever and chills.  Gastrointestinal: Positive for abdominal pain (cramping).  Genitourinary:       Neg for vaginal bleeding  Neurological: Negative for headaches.   Physical Exam   Last menstrual period 05/19/2011.  Physical Exam  Constitutional: She is oriented to person, place, and time. She appears well-developed and well-nourished.  HENT:    Head: Normocephalic.  Cardiovascular: Normal rate.   Genitourinary:       Not indicated  Neurological: She is alert and oriented to person, place, and time.  Skin: Skin is warm and dry.  Psychiatric: She has a normal mood and affect. Her behavior is normal.      Results for orders placed during the hospital encounter of 07/03/11 (from the past 24 hour(s))  HCG, QUANTITATIVE, PREGNANCY     Status: Abnormal   Collection Time   07/03/11 11:37 AM      Component Value Range   hCG, Beta Chain, Quant, S 5919 (*) <5 (mIU/mL)       *RADIOLOGY REPORT*  Clinical Data: Embryonic viability assessment. Appropriate rising  quantitative beta HCG. Cramping.  TRANSVAGINAL OB ULTRASOUND  Technique: Transvaginal ultrasound was performed for evaluation of  the gestation as well as the maternal uterus and adnexal regions.  Comparison: 06/25/2011  Findings: Intrauterine gestational sac is observed with a yolk sac  identified. However, fetal pole is not yet seen.  Mean sac diameter is 8.6 mm compatible with 5 weeks 4 days a  station.  No subchorionic hemorrhage is observed. Both maternal ovaries  appear normal.  There is a small amount of free pelvic fluid in the cul-de-sac.  IMPRESSION:  1. Single intrauterine gestational  sac. Yolk sac is visible with  the fetal pole is not yet visible. Mean sac diameter places the  pregnancy at 5 weeks 4 days.  2. Small amount of free pelvic fluid.  Original Report Authenticated By: Dellia Cloud, M.D.   MAU Course  Procedures  MDM   Assessment and Plan  A: Intrauterine pregnancy confirmed by Ultrasound [redacted]w[redacted]d  P;  Begin prenatal care with doctor of your choice     Begin prenatal vitamins Cohen Boettner,EVE M 07/03/2011, 12:38 PM

## 2011-07-03 NOTE — Discharge Instructions (Signed)
ABCs of Pregnancy A Antepartum care is very important. Be sure you see your doctor and get prenatal care as soon as you think you are pregnant. At this time, you will be tested for infection, genetic abnormalities and potential problems with you and the pregnancy. This is the time to discuss diet, exercise, work, medications, labor, pain medication during labor and the possibility of a cesarean delivery. Ask any questions that may concern you. It is important to see your doctor regularly throughout your pregnancy. Avoid exposure to toxic substances and chemicals - such as cleaning solvents, lead and mercury, some insecticides, and paint. Pregnant women should avoid exposure to paint fumes, and fumes that cause you to feel ill, dizzy or faint. When possible, it is a good idea to have a pre-pregnancy consultation with your caregiver to begin some important recommendations your caregiver suggests such as, taking folic acid, exercising, quitting smoking, avoiding alcoholic beverages, etc. B Breastfeeding is the healthiest choice for both you and your baby. It has many nutritional benefits for the baby and health benefits for the mother. It also creates a very tight and loving bond between the baby and mother. Talk to your doctor, your family and friends, and your employer about how you choose to feed your baby and how they can support you in your decision. Not all birth defects can be prevented, but a woman can take actions that may increase her chance of having a healthy baby. Many birth defects happen very early in pregnancy, sometimes before a woman even knows she is pregnant. Birth defects or abnormalities of any child in your or the father's family should be discussed with your caregiver. Get a good support bra as your breast size changes. Wear it especially when you exercise and when nursing.  C Celebrate the news of your pregnancy with the your spouse/father and family. Childbirth classes are helpful to  take for you and the spouse/father because it helps to understand what happens during the pregnancy, labor and delivery. Cesarean delivery should be discussed with your doctor so you are prepared for that possibility. The pros and cons of circumcision if it is a boy, should be discussed with your pediatrician. Cigarette smoking during pregnancy can result in low birth weight babies. It has been associated with infertility, miscarriages, tubal pregnancies, infant death (mortality) and poor health (morbidity) in childhood. Additionally, cigarette smoking may cause long-term learning disabilities. If you smoke, you should try to quit before getting pregnant and not smoke during the pregnancy. Secondary smoke may also harm a mother and her developing baby. It is a good idea to ask people to stop smoking around you during your pregnancy and after the baby is born. Extra calcium is necessary when you are pregnant and is found in your prenatal vitamin, in dairy products, green leafy vegetables and in calcium supplements. D A healthy diet according to your current weight and height, along with vitamins and mineral supplements should be discussed with your caregiver. Domestic abuse or violence should be made known to your doctor right away to get the situation corrected. Drink more water when you exercise to keep hydrated. Discomfort of your back and legs usually develops and progresses from the middle of the second trimester through to delivery of the baby. This is because of the enlarging baby and uterus, which may also affect your balance. Do not take illegal drugs. Illegal drugs can seriously harm the baby and you. Drink extra fluids (water is best) throughout pregnancy to help   your body keep up with the increases in your blood volume. Drink at least 6 to 8 glasses of water, fruit juice, or milk each day. A good way to know you are drinking enough fluid is when your urine looks almost like clear water or is very light  yellow.  E Eat healthy to get the nutrients you and your unborn baby need. Your meals should include the five basic food groups. Exercise (30 minutes of light to moderate exercise a day) is important and encouraged during pregnancy, if there are no medical problems or problems with the pregnancy. Exercise that causes discomfort or dizziness should be stopped and reported to your caregiver. Emotions during pregnancy can change from being ecstatic to depression and should be understood by you, your partner and your family. F Fetal screening with ultrasound, amniocentesis and monitoring during pregnancy and labor is common and sometimes necessary. Take 400 micrograms of folic acid daily both before, when possible, and during the first few months of pregnancy to reduce the risk of birth defects of the brain and spine. All women who could possibly become pregnant should take a vitamin with folic acid, every day. It is also important to eat a healthy diet with fortified foods (enriched grain products, including cereals, rice, breads, and pastas) and foods with natural sources of folate (orange juice, green leafy vegetables, beans, peanuts, broccoli, asparagus, peas, and lentils). The father should be involved with all aspects of the pregnancy including, the prenatal care, childbirth classes, labor, delivery, and postpartum time. Fathers may also have emotional concerns about being a father, financial needs, and raising a family. G Genetic testing should be done appropriately. It is important to know your family and the father's history. If there have been problems with pregnancies or birth defects in your family, report these to your doctor. Also, genetic counselors can talk with you about the information you might need in making decisions about having a family. You can call a major medical center in your area for help in finding a board-certified genetic counselor. Genetic testing and counseling should be done  before pregnancy when possible, especially if there is a history of problems in the mother's or father's family. Certain ethnic backgrounds are more at risk for genetic defects. H Get familiar with the hospital where you will be having your baby. Get to know how long it takes to get there, the labor and delivery area, and the hospital procedures. Be sure your medical insurance is accepted there. Get your home ready for the baby including, clothes, the baby's room (when possible), furniture and car seat. Hand washing is important throughout the day, especially after handling raw meat and poultry, changing the baby's diaper or using the bathroom. This can help prevent the spread of many bacteria and viruses that cause infection. Your hair may become dry and thinner, but will return to normal a few weeks after the baby is born. Heartburn is a common problem that can be treated by taking antacids recommended by your caregiver, eating smaller meals 5 or 6 times a day, not drinking liquids when eating, drinking between meals and raising the head of your bed 2 to 3 inches. I Insurance to cover you, the baby, doctor and hospital should be reviewed so that you will be prepared to pay any costs not covered by your insurance plan. If you do not have medical insurance, there are usually clinics and services available for you in your community. Take 30 milligrams of iron during   your pregnancy as prescribed by your doctor to reduce the risk of low red blood cells (anemia) later in pregnancy. All women of childbearing age should eat a diet rich in iron. J There should be a joint effort for the mother, father and any other children to adapt to the pregnancy financially, emotionally, and psychologically during the pregnancy. Join a support group for moms-to-be. Or, join a class on parenting or childbirth. Have the family participate when possible. K Know your limits. Let your caregiver know if you experience any of the  following:   Pain of any kind.   Strong cramps.   You develop a lot of weight in a short period of time (5 pounds in 3 to 5 days).   Vaginal bleeding, leaking of amniotic fluid.   Headache, vision problems.   Dizziness, fainting, shortness of breath.   Chest pain.   Fever of 102 F (38.9 C) or higher.   Gush of clear fluid from your vagina.   Painful urination.   Domestic violence.   Irregular heartbeat (palpitations).   Rapid beating of the heart (tachycardia).   Constant feeling sick to your stomach (nauseous) and vomiting.   Trouble walking, fluid retention (edema).   Muscle weakness.   If your baby has decreased activity.   Persistent diarrhea.   Abnormal vaginal discharge.   Uterine contractions at 20-minute intervals.   Back pain that travels down your leg.  L Learn and practice that what you eat and drink should be in moderation and healthy for you and your baby. Legal drugs such as alcohol and caffeine are important issues for pregnant women. There is no safe amount of alcohol a woman can drink while pregnant. Fetal alcohol syndrome, a disorder characterized by growth retardation, facial abnormalities, and central nervous system dysfunction, is caused by a woman's use of alcohol during pregnancy. Caffeine, found in tea, coffee, soft drinks and chocolate, should also be limited. Be sure to read labels when trying to cut down on caffeine during pregnancy. More than 200 foods, beverages, and over-the-counter medications contain caffeine and have a high salt content! There are coffees and teas that do not contain caffeine. M Medical conditions such as diabetes, epilepsy, and high blood pressure should be treated and kept under control before pregnancy when possible, but especially during pregnancy. Ask your caregiver about any medications that may need to be changed or adjusted during pregnancy. If you are currently taking any medications, ask your caregiver if it  is safe to take them while you are pregnant or before getting pregnant when possible. Also, be sure to discuss any herbs or vitamins you are taking. They are medicines, too! Discuss with your doctor all medications, prescribed and over-the-counter, that you are taking. During your prenatal visit, discuss the medications your doctor may give you during labor and delivery. N Never be afraid to ask your doctor or caregiver questions about your health, the progress of the pregnancy, family problems, stressful situations, and recommendation for a pediatrician, if you do not have one. It is better to take all precautions and discuss any questions or concerns you may have during your office visits. It is a good idea to write down your questions before you visit the doctor. O Over-the-counter cough and cold remedies may contain alcohol or other ingredients that should be avoided during pregnancy. Ask your caregiver about prescription, herbs or over-the-counter medications that you are taking or may consider taking while pregnant.  P Physical activity during pregnancy can   benefit both you and your baby by lessening discomfort and fatigue, providing a sense of well-being, and increasing the likelihood of early recovery after delivery. Light to moderate exercise during pregnancy strengthens the belly (abdominal) and back muscles. This helps improve posture. Practicing yoga, walking, swimming, and cycling on a stationary bicycle are usually safe exercises for pregnant women. Avoid scuba diving, exercise at high altitudes (over 3000 feet), skiing, horseback riding, contact sports, etc. Always check with your doctor before beginning any kind of exercise, especially during pregnancy and especially if you did not exercise before getting pregnant. Q Queasiness, stomach upset and morning sickness are common during pregnancy. Eating a couple of crackers or dry toast before getting out of bed. Foods that you normally love may  make you feel sick to your stomach. You may need to substitute other nutritious foods. Eating 5 or 6 small meals a day instead of 3 large ones may make you feel better. Do not drink with your meals, drink between meals. Questions that you have should be written down and asked during your prenatal visits. R Read about and make plans to baby-proof your home. There are important tips for making your home a safer environment for your baby. Review the tips and make your home safer for you and your baby. Read food labels regarding calories, salt and fat content in the food. S Saunas, hot tubs, and steam rooms should be avoided while you are pregnant. Excessive high heat may be harmful during your pregnancy. Your caregiver will screen and examine you for sexually transmitted diseases and genetic disorders during your prenatal visits. Learn the signs of labor. Sexual relations while pregnant is safe unless there is a medical or pregnancy problem and your caregiver advises against it. T Traveling long distances should be avoided especially in the third trimester of your pregnancy. If you do have to travel out of state, be sure to take a copy of your medical records and medical insurance plan with you. You should not travel long distances without seeing your doctor first. Most airlines will not allow you to travel after 36 weeks of pregnancy. Toxoplasmosis is an infection caused by a parasite that can seriously harm an unborn baby. Avoid eating undercooked meat and handling cat litter. Be sure to wear gloves when gardening. Tingling of the hands and fingers is not unusual and is due to fluid retention. This will go away after the baby is born. U Womb (uterus) size increases during the first trimester. Your kidneys will begin to function more efficiently. This may cause you to feel the need to urinate more often. You may also leak urine when sneezing, coughing or laughing. This is due to the growing uterus pressing  against your bladder, which lies directly in front of and slightly under the uterus during the first few months of pregnancy. If you experience burning along with frequency of urination or bloody urine, be sure to tell your doctor. The size of your uterus in the third trimester may cause a problem with your balance. It is advisable to maintain good posture and avoid wearing high heels during this time. An ultrasound of your baby may be necessary during your pregnancy and is safe for you and your baby. V Vaccinations are an important concern for pregnant women. Get needed vaccines before pregnancy. Center for Disease Control (www.cdc.gov) has clear guidelines for the use of vaccines during pregnancy. Review the list, be sure to discuss it with your doctor. Prenatal vitamins are helpful   and healthy for you and the baby. Do not take extra vitamins except what is recommended. Taking too much of certain vitamins can cause overdose problems. Continuous vomiting should be reported to your caregiver. Varicose veins may appear especially if there is a family history of varicose veins. They should subside after the delivery of the baby. Support hose helps if there is leg discomfort. W Being overweight or underweight during pregnancy may cause problems. Try to get within 15 pounds of your ideal weight before pregnancy. Remember, pregnancy is not a time to be dieting! Do not stop eating or start skipping meals as your weight increases. Both you and your baby need the calories and nutrition you receive from a healthy diet. Be sure to consult with your doctor about your diet. There is a formula and diet plan available depending on whether you are overweight or underweight. Your caregiver or nutritionist can help and advise you if necessary. X Avoid X-rays. If you must have dental work or diagnostic tests, tell your dentist or physician that you are pregnant so that extra care can be taken. X-rays should only be taken when  the risks of not taking them outweigh the risk of taking them. If needed, only the minimum amount of radiation should be used. When X-rays are necessary, protective lead shields should be used to cover areas of the body that are not being X-rayed. Y Your baby loves you. Breastfeeding your baby creates a loving and very close bond between the two of you. Give your baby a healthy environment to live in while you are pregnant. Infants and children require constant care and guidance. Their health and safety should be carefully watched at all times. After the baby is born, rest or take a nap when the baby is sleeping. Z Get your ZZZs. Be sure to get plenty of rest. Resting on your side as often as possible, especially on your left side is advised. It provides the best circulation to your baby and helps reduce swelling. Try taking a nap for 30 to 45 minutes in the afternoon when possible. After the baby is born rest or take a nap when the baby is sleeping. Try elevating your feet for that amount of time when possible. It helps the circulation in your legs and helps reduce swelling.  Most information courtesy of the CDC. Document Released: 01/23/2005 Document Revised: 01/12/2011 Document Reviewed: 10/07/2008 [employer] Patient Information 2012 [employer]. 

## 2011-07-03 NOTE — MAU Note (Signed)
Here for follow up.  Has been cramping for 2 wks, no change.

## 2011-07-17 ENCOUNTER — Emergency Department (HOSPITAL_COMMUNITY): Payer: Managed Care, Other (non HMO)

## 2011-07-17 ENCOUNTER — Emergency Department (HOSPITAL_COMMUNITY)
Admission: EM | Admit: 2011-07-17 | Discharge: 2011-07-17 | Disposition: A | Discharge status: Home / Self Care | Payer: Managed Care, Other (non HMO) | Attending: Emergency Medicine | Admitting: Emergency Medicine

## 2011-07-17 DIAGNOSIS — IMO0002 Reserved for concepts with insufficient information to code with codable children: Secondary | ICD-10-CM | POA: Insufficient documentation

## 2011-07-17 DIAGNOSIS — T148XXA Other injury of unspecified body region, initial encounter: Secondary | ICD-10-CM

## 2011-07-17 DIAGNOSIS — K219 Gastro-esophageal reflux disease without esophagitis: Secondary | ICD-10-CM | POA: Insufficient documentation

## 2011-07-17 DIAGNOSIS — S60229A Contusion of unspecified hand, initial encounter: Secondary | ICD-10-CM | POA: Insufficient documentation

## 2011-07-17 DIAGNOSIS — O99891 Other specified diseases and conditions complicating pregnancy: Secondary | ICD-10-CM | POA: Insufficient documentation

## 2011-07-17 MED ORDER — TETANUS-DIPHTH-ACELL PERTUSSIS 5-2.5-18.5 LF-MCG/0.5 IM SUSP
0.5000 mL | Freq: Once | INTRAMUSCULAR | Status: AC
  Administered 2011-07-17: 0.5 mL via INTRAMUSCULAR
  Filled 2011-07-17: qty 0.5
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
## 2011-07-17 NOTE — ED Notes (Signed)
Patient is AOx4 and comfortable with her discharge instructions. 
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
## 2011-07-17 NOTE — ED Notes (Signed)
CORRECTION:  TDAP NOT GIVEN DUE TO RISK R/T PREGNANCY.

## 2011-07-24 ENCOUNTER — Other Ambulatory Visit: Payer: Self-pay | Admitting: Obstetrics and Gynecology

## 2011-07-24 NOTE — H&P (Signed)
Angela Riley is an 21 y.o. female with missed abortion.  CRL c/w  6 3/7 weeks. No fetal heart beat on prolonged exam.  Pertinent Gynecological History: Menses: pregnant Bleeding: N/A Contraception: none DES exposure: unknown Blood transfusions: none Sexually transmitted diseases: no past history Previous GYN Procedures: DNC  Last mammogram: normal Date: 2013 Last pap: normal Date: 2013 OB History: G3, P0   Menstrual History: Menarche age: unknown Patient's last menstrual period was 05/19/2011.    Past Medical History  Diagnosis Date  . Asthma   . GERD (gastroesophageal reflux disease)   . Chlamydia 01/2010  . BV (bacterial vaginosis)   . Urinary tract infection   . Anxiety   . Ovarian cyst     Past Surgical History  Procedure Date  . Induced abortion     x2    Family History  Problem Relation Age of Onset  . Anesthesia problems Neg Hx     Social History:  reports that she has never smoked. She does not have any smokeless tobacco history on file. She reports that she does not drink alcohol or use illicit drugs.  Allergies: No Known Allergies  No prescriptions prior to admission    Review of Systems  Constitutional: Negative for fever.  Neurological: Negative for headaches.    Last menstrual period 05/19/2011. Physical Exam  Constitutional: She appears well-developed and well-nourished.  Cardiovascular: Normal rate and regular rhythm.   Respiratory: Effort normal and breath sounds normal.  GI: There is no tenderness.    No results found for this or any previous visit (from the past 24 hour(s)).  No results found.  Assessment/Plan: 21 yo G3P0 with CRL of 6 3/7 weeks with MAB. D/W patient options. D/W D&E and risks-infection, uterine perforation and organ damage, IU synechiae and infertility, bleeding/transfusion-HIV/Hep, DVT/PE, pneumonia.  All questions answered.  Brookes Craine II,Jamille Fisher E 07/24/2011, 6:09 PM

## 2011-07-25 ENCOUNTER — Ambulatory Visit (HOSPITAL_COMMUNITY)
Admission: RE | Admit: 2011-07-25 | Discharge: 2011-07-25 | Disposition: A | Discharge status: Home / Self Care | Payer: Managed Care, Other (non HMO) | Source: Ambulatory Visit | Attending: Obstetrics and Gynecology | Admitting: Obstetrics and Gynecology

## 2011-07-25 ENCOUNTER — Encounter (HOSPITAL_COMMUNITY): Payer: Self-pay | Admitting: Anesthesiology

## 2011-07-25 ENCOUNTER — Ambulatory Visit (HOSPITAL_COMMUNITY): Payer: Managed Care, Other (non HMO) | Admitting: Anesthesiology

## 2011-07-25 ENCOUNTER — Encounter (HOSPITAL_COMMUNITY)
Admission: RE | Disposition: A | Discharge status: Home / Self Care | Payer: Self-pay | Source: Ambulatory Visit | Attending: Obstetrics and Gynecology

## 2011-07-25 ENCOUNTER — Encounter (HOSPITAL_COMMUNITY): Payer: Self-pay | Admitting: *Deleted

## 2011-07-25 DIAGNOSIS — O021 Missed abortion: Secondary | ICD-10-CM | POA: Insufficient documentation

## 2011-07-25 HISTORY — PX: DILATION AND EVACUATION: SHX1459

## 2011-07-25 LAB — CBC
MCH: 29.3 pg (ref 26.0–34.0)
MCHC: 33.6 g/dL (ref 30.0–36.0)
Platelets: 231 10*3/uL (ref 150–400)
RBC: 4.54 MIL/uL (ref 3.87–5.11)
RDW: 13.6 % (ref 11.5–15.5)

## 2011-07-25 SURGERY — DILATION AND EVACUATION, UTERUS
Anesthesia: Monitor Anesthesia Care

## 2011-07-25 MED ORDER — METHYLERGONOVINE MALEATE 0.2 MG/ML IJ SOLN
INTRAMUSCULAR | Status: AC
  Filled 2011-07-25: qty 1

## 2011-07-25 MED ORDER — CEFAZOLIN SODIUM 1-5 GM-% IV SOLN
INTRAVENOUS | Status: AC
  Administered 2011-07-25: 1 g via INTRAVENOUS
  Filled 2011-07-25: qty 50

## 2011-07-25 MED ORDER — ACETAMINOPHEN 325 MG PO TABS: 325.0000 mg | ORAL_TABLET | ORAL | Status: DC | PRN

## 2011-07-25 MED ORDER — METHYLERGONOVINE MALEATE 0.2 MG PO TABS: 0.2000 mg | ORAL_TABLET | Freq: Four times a day (QID) | ORAL | Status: DC

## 2011-07-25 MED ORDER — KETOROLAC TROMETHAMINE 30 MG/ML IJ SOLN: 15.0000 mg | Freq: Once | INTRAMUSCULAR | Status: DC | PRN

## 2011-07-25 MED ORDER — HYDROCODONE-ACETAMINOPHEN 5-325 MG PO TABS
ORAL_TABLET | ORAL | Status: AC
  Administered 2011-07-25: 1 via ORAL
  Filled 2011-07-25: qty 1

## 2011-07-25 MED ORDER — MIDAZOLAM HCL 2 MG/2ML IJ SOLN: 0.5000 mg | Freq: Once | INTRAMUSCULAR | Status: DC | PRN

## 2011-07-25 MED ORDER — FENTANYL CITRATE 0.05 MG/ML IJ SOLN
INTRAMUSCULAR | Status: AC
  Filled 2011-07-25: qty 2

## 2011-07-25 MED ORDER — METHYLERGONOVINE MALEATE 0.2 MG/ML IJ SOLN
INTRAMUSCULAR | Status: DC | PRN
  Administered 2011-07-25: 0.2 mg via INTRAMUSCULAR

## 2011-07-25 MED ORDER — OXYTOCIN 10 UNIT/ML IJ SOLN
INTRAMUSCULAR | Status: DC | PRN
  Administered 2011-07-25: 20 [IU] via INTRAMUSCULAR

## 2011-07-25 MED ORDER — FENTANYL CITRATE 0.05 MG/ML IJ SOLN
25.0000 ug | INTRAMUSCULAR | Status: DC | PRN
  Administered 2011-07-25: 50 ug via INTRAVENOUS

## 2011-07-25 MED ORDER — PROPOFOL 10 MG/ML IV EMUL
INTRAVENOUS | Status: AC
  Filled 2011-07-25: qty 20

## 2011-07-25 MED ORDER — PROPOFOL 10 MG/ML IV EMUL
INTRAVENOUS | Status: DC | PRN
  Administered 2011-07-25 (×2): 30 mg via INTRAVENOUS
  Administered 2011-07-25: 20 mg via INTRAVENOUS
  Administered 2011-07-25: 10 mg via INTRAVENOUS

## 2011-07-25 MED ORDER — LIDOCAINE HCL (CARDIAC) 20 MG/ML IV SOLN
INTRAVENOUS | Status: DC | PRN
  Administered 2011-07-25: 50 mg via INTRAVENOUS

## 2011-07-25 MED ORDER — MIDAZOLAM HCL 5 MG/5ML IJ SOLN
INTRAMUSCULAR | Status: DC | PRN
  Administered 2011-07-25: 2 mg via INTRAVENOUS

## 2011-07-25 MED ORDER — FAMOTIDINE IN NACL 20-0.9 MG/50ML-% IV SOLN
20.0000 mg | Freq: Once | INTRAVENOUS | Status: AC
  Administered 2011-07-25: 20 mg via INTRAVENOUS
  Filled 2011-07-25: qty 50

## 2011-07-25 MED ORDER — LACTATED RINGERS IV SOLN
INTRAVENOUS | Status: DC
  Administered 2011-07-25: 11:00:00 via INTRAVENOUS

## 2011-07-25 MED ORDER — DEXAMETHASONE SODIUM PHOSPHATE 10 MG/ML IJ SOLN
INTRAMUSCULAR | Status: AC
  Filled 2011-07-25: qty 1

## 2011-07-25 MED ORDER — MIDAZOLAM HCL 2 MG/2ML IJ SOLN
INTRAMUSCULAR | Status: AC
  Filled 2011-07-25: qty 2

## 2011-07-25 MED ORDER — CEFAZOLIN SODIUM 1-5 GM-% IV SOLN: 1.0000 g | INTRAVENOUS | Status: DC

## 2011-07-25 MED ORDER — ONDANSETRON HCL 4 MG/2ML IJ SOLN
INTRAMUSCULAR | Status: AC
  Filled 2011-07-25: qty 2

## 2011-07-25 MED ORDER — LIDOCAINE HCL 1 % IJ SOLN
INTRAMUSCULAR | Status: DC | PRN
  Administered 2011-07-25: 20 mL

## 2011-07-25 MED ORDER — DEXAMETHASONE SODIUM PHOSPHATE 10 MG/ML IJ SOLN
INTRAMUSCULAR | Status: DC | PRN
  Administered 2011-07-25: 10 mg via INTRAVENOUS

## 2011-07-25 MED ORDER — MEPERIDINE HCL 25 MG/ML IJ SOLN: 6.2500 mg | INTRAMUSCULAR | Status: DC | PRN

## 2011-07-25 MED ORDER — FENTANYL CITRATE 0.05 MG/ML IJ SOLN
INTRAMUSCULAR | Status: AC
  Administered 2011-07-25: 50 ug via INTRAVENOUS
  Filled 2011-07-25: qty 2

## 2011-07-25 MED ORDER — HYDROCODONE-ACETAMINOPHEN 5-325 MG PO TABS
1.0000 | ORAL_TABLET | Freq: Once | ORAL | Status: AC
  Administered 2011-07-25: 1 via ORAL

## 2011-07-25 MED ORDER — OXYTOCIN 10 UNIT/ML IJ SOLN
INTRAMUSCULAR | Status: AC
  Filled 2011-07-25: qty 2

## 2011-07-25 MED ORDER — ONDANSETRON HCL 4 MG/2ML IJ SOLN
INTRAMUSCULAR | Status: DC | PRN
  Administered 2011-07-25: 4 mg via INTRAVENOUS

## 2011-07-25 MED ORDER — LIDOCAINE HCL (CARDIAC) 20 MG/ML IV SOLN
INTRAVENOUS | Status: AC
  Filled 2011-07-25: qty 5

## 2011-07-25 MED ORDER — PROMETHAZINE HCL 25 MG/ML IJ SOLN: 6.2500 mg | INTRAMUSCULAR | Status: DC | PRN

## 2011-07-25 MED ORDER — HYDROCODONE-ACETAMINOPHEN 5-500 MG PO TABS: 1.0000 | ORAL_TABLET | Freq: Four times a day (QID) | ORAL | Status: AC | PRN

## 2011-07-25 MED ORDER — FENTANYL CITRATE 0.05 MG/ML IJ SOLN
INTRAMUSCULAR | Status: DC | PRN
  Administered 2011-07-25: 100 ug via INTRAVENOUS

## 2011-07-25 SURGICAL SUPPLY — 17 items
CATH ROBINSON RED A/P 16FR (CATHETERS) ×2 IMPLANT
CLOTH BEACON ORANGE TIMEOUT ST (SAFETY) ×2 IMPLANT
DECANTER SPIKE VIAL GLASS SM (MISCELLANEOUS) ×2 IMPLANT
GLOVE BIO SURGEON STRL SZ8 (GLOVE) ×4 IMPLANT
GOWN PREVENTION PLUS LG XLONG (DISPOSABLE) ×2 IMPLANT
KIT BERKELEY 1ST TRIMESTER 3/8 (MISCELLANEOUS) ×2 IMPLANT
NEEDLE SPNL 22GX3.5 QUINCKE BK (NEEDLE) ×2 IMPLANT
NS IRRIG 1000ML POUR BTL (IV SOLUTION) ×2 IMPLANT
PACK VAGINAL MINOR WOMEN LF (CUSTOM PROCEDURE TRAY) ×2 IMPLANT
PAD PREP 24X48 CUFFED NSTRL (MISCELLANEOUS) ×2 IMPLANT
SET BERKELEY SUCTION TUBING (SUCTIONS) ×2 IMPLANT
SYR CONTROL 10ML LL (SYRINGE) ×2 IMPLANT
TOWEL OR 17X24 6PK STRL BLUE (TOWEL DISPOSABLE) ×4 IMPLANT
VACURETTE 10 RIGID CVD (CANNULA) IMPLANT
VACURETTE 7MM CVD STRL WRAP (CANNULA) IMPLANT
VACURETTE 8 RIGID CVD (CANNULA) IMPLANT
VACURETTE 9 RIGID CVD (CANNULA) IMPLANT

## 2011-07-25 NOTE — Op Note (Signed)
NAMEJACLYNNE, BALDO NO.:  [id number]  MEDICAL RECORD NO.:  [id number]  LOCATION:  [facility]                          FACILITY:  [facility]  PHYSICIAN:  Guy Sandifer. Henderson Cloud, M.D. DATE OF BIRTH:  [birth year]  DATE OF PROCEDURE:  07/25/2011 DATE OF DISCHARGE:                              OPERATIVE REPORT   PREOPERATIVE DIAGNOSIS:  Missed abortion.  POSTOPERATIVE DIAGNOSIS:  Missed abortion.  PROCEDURE:  Dilatation and evacuation.  SURGEON:  Guy Sandifer. Giang Hemme, MD  ANESTHESIA:  MAC.  ESTIMATED BLOOD LOSS:  100 mL.  SPECIMENS:  Products of conception to pathology.  INDICATIONS AND CONSENT:  The patient is a 21 year old single black female, G3, P0 at 59 and 4/7th weeks by dates.  Ultrasound last week in the office revealed a crown-rump length of 6 weeks and 3 days with no fetal heartbeat upon prolonged examination.  Options were reviewed. Dilatation and evacuation has been reviewed preoperatively.  Potential risks and complications discussed preoperatively including not limited to, infection, organ damage, bleeding requiring transfusion of blood products with HIV and hepatitis acquisition, DVT, PE, pneumonia, uterine perforation, intrauterine synechiae, and secondary infertility.  All questions have been answered and consent signed on the chart.  PROCEDURE:  The patient was taken to the operating room where she was identified, placed in dorsal supine position and given sedation.  She was placed in the dorsal lithotomy position.  Time-out was undertaken. She was prepped, bladder straight catheterized and draped in a sterile fashion.  Uterus is 6-7 weeks in size.  Bivalve speculum was placed. The anterior cervical lip was injected with 1% lidocaine and grasped with single-tooth tenaculum.  A 20 mL of the same solution were placed at 2, 4, 5, 7, 8, and 10 o'clock positions.  Cervix was gently progressively dilated.  Suction curettage with a #7 curved curette undertaken.   Alternating sharp and suction curettage was carried out until the cavity was clean.  Pitocin 20 units were added to the remaining 700 mL of IV fluids after the initial pass of the suction curette.  She was also given Methergine 0.2 mg IM to control bleeding. At the end, the cavity was clean and good hemostasis was noted.  All instruments were removed.  All counts were correct.  The patient was awakened and taken to recovery room in stable condition.  Blood type is O positive.  Follow up with me in the office in 2 weeks.     Guy Sandifer Henderson Cloud, M.D.     JET/MEDQ  D:  07/25/2011  T:  07/25/2011  Job:  [id number]

## 2011-07-25 NOTE — Discharge Instructions (Signed)
No vaginal entryDISCHARGE INSTRUCTIONS: D&C / D&E The following instructions have been prepared to help you care for yourself upon your return home.   Personal hygiene: Marland Kitchen Use sanitary pads for vaginal drainage, not tampons. . Shower the day after your procedure. . NO tub baths, pools or Jacuzzis for 2-3 weeks. . Wipe front to back after using the bathroom.  Activity and limitations: . Do NOT drive or operate any equipment for 24 hours. The effects of anesthesia are still present and drowsiness may result. . Do NOT rest in bed all day. . Walking is encouraged. . Walk up and down stairs slowly. . You may resume your normal activity in one to two days or as indicated by your physician.  Sexual activity: NO intercourse for at least 2 weeks after the procedure, or as indicated by your physician.  Diet: Eat a light meal as desired this evening. You may resume your usual diet tomorrow.  Return to work: You may resume your work activities in one to two days or as indicated by your doctor.  What to expect after your surgery: Expect to have vaginal bleeding/discharge for 2-3 days and spotting for up to 10 days. It is not unusual to have soreness for up to 1-2 weeks. You may have a slight burning sensation when you urinate for the first day. Mild cramps may continue for a couple of days. You may have a regular period in 2-6 weeks.  Call your doctor for any of the following: . Excessive vaginal bleeding, saturating and changing one pad every hour. . Inability to urinate 6 hours after discharge from hospital. . Pain not relieved by pain medication. . Fever of 100.4 F or greater. . Unusual vaginal discharge or odor.  Return to office ________________ Call for an appointment ___________________  Patient's signature: ______________________  Nurse's signature ________________________  Post Anesthesia Care Unit [phone]  DISCHARGE INSTRUCTIONS: D&C / D&E The following instructions have been  prepared to help you care for yourself upon your return home.   Personal hygiene: Marland Kitchen Use sanitary pads for vaginal drainage, not tampons. . Shower the day after your procedure. . NO tub baths, pools or Jacuzzis for 2-3 weeks. . Wipe front to back after using the bathroom.  Activity and limitations: . Do NOT drive or operate any equipment for 24 hours. The effects of anesthesia are still present and drowsiness may result. . Do NOT rest in bed all day. . Walking is encouraged. . Walk up and down stairs slowly. . You may resume your normal activity in one to two days or as indicated by your physician.  Sexual activity: NO intercourse for at least 2 weeks after the procedure, or as indicated by your physician.  Diet: Eat a light meal as desired this evening. You may resume your usual diet tomorrow.  Return to work: You may resume your work activities in one to two days or as indicated by your doctor.  What to expect after your surgery: Expect to have vaginal bleeding/discharge for 2-3 days and spotting for up to 10 days. It is not unusual to have soreness for up to 1-2 weeks. You may have a slight burning sensation when you urinate for the first day. Mild cramps may continue for a couple of days. You may have a regular period in 2-6 weeks.  Call your doctor for any of the following: . Excessive vaginal bleeding, saturating and changing one pad every hour. . Inability to urinate 6 hours after discharge from hospital. .  Pain not relieved by pain medication. . Fever of 100.4 F or greater. . Unusual vaginal discharge or odor.  Return to office ________________ Call for an appointment ___________________  Patient's signature: ______________________  Nurse's signature ________________________  Post Anesthesia Care Unit [phone]

## 2011-07-25 NOTE — Transfer of Care (Signed)
Immediate Anesthesia Transfer of Care Note  Patient: Angela Riley  Procedure(s) Performed: Procedure(s) (LRB): DILATATION AND EVACUATION (N/A)  Patient Location: PACU  Anesthesia Type: MAC  Level of Consciousness: sedated  Airway & Oxygen Therapy: Patient Spontanous Breathing  Post-op Assessment: Report given to PACU RN  Post vital signs: stable  Complications: No apparent anesthesia complications

## 2011-07-25 NOTE — Anesthesia Preprocedure Evaluation (Signed)
Anesthesia Evaluation  Patient identified by MRN, date of birth, ID band Patient awake    Reviewed: Allergy & Precautions, H&P , Patient's Chart, lab work & pertinent test results, reviewed documented beta blocker date and time   History of Anesthesia Complications Negative for: history of anesthetic complications  Airway Mallampati: II TM Distance: >3 FB Neck ROM: full    Dental No notable dental hx.    Pulmonary neg pulmonary ROS, asthma ,  breath sounds clear to auscultation  Pulmonary exam normal       Cardiovascular Exercise Tolerance: Good negative cardio ROS  Rhythm:regular Rate:Normal     Neuro/Psych negative neurological ROS  negative psych ROS   GI/Hepatic negative GI ROS, Neg liver ROS, GERD-  Controlled,  Endo/Other  negative endocrine ROS  Renal/GU negative Renal ROS     Musculoskeletal   Abdominal   Peds  Hematology negative hematology ROS (+)   Anesthesia Other Findings   Reproductive/Obstetrics negative OB ROS                           Anesthesia Physical Anesthesia Plan  ASA: II  Anesthesia Plan: MAC   Post-op Pain Management:    Induction:   Airway Management Planned:   Additional Equipment:   Intra-op Plan:   Post-operative Plan:   Informed Consent: I have reviewed the patients History and Physical, chart, labs and discussed the procedure including the risks, benefits and alternatives for the proposed anesthesia with the patient or authorized representative who has indicated his/her understanding and acceptance.   Dental Advisory Given  Plan Discussed with: CRNA, Surgeon and Anesthesiologist  Anesthesia Plan Comments:         Anesthesia Quick Evaluation

## 2011-07-25 NOTE — Brief Op Note (Signed)
07/25/2011  1:16 PM  PATIENT:  Angela Riley  21 y.o. female  PRE-OPERATIVE DIAGNOSIS:  Missed Abortion  POST-OPERATIVE DIAGNOSIS:  Missed Abortion  PROCEDURE:  Procedure(s) (LRB): DILATATION AND EVACUATION (N/A)  SURGEON:  Surgeon(s) and Role:    * Leslie Andrea, MD - Primary  PHYSICIAN ASSISTANT:   ASSISTANTS: none   ANESTHESIA:   MAC  EBL:  Total I/O In: 100 [I.V.:100] Out: 100 [Urine:100]  BLOOD ADMINISTERED:none  DRAINS: none   LOCAL MEDICATIONS USED:  LIDOCAINE   SPECIMEN:  Source of Specimen:  products of conception  DISPOSITION OF SPECIMEN:  PATHOLOGY  COUNTS:  YES  TOURNIQUET:  * No tourniquets in log *  DICTATION: .Other Dictation: Dictation Number [id number]  PLAN OF CARE: Discharge to home after PACU  PATIENT DISPOSITION:  PACU - hemodynamically stable.   Delay start of Pharmacological VTE agent (>24hrs) due to surgical blood loss or risk of bleeding: not applicable

## 2011-07-25 NOTE — OR Nursing (Signed)
1410 pt co some chest pressure ,pt in sinus rhythm blood pressure 120/67. Pt co chest pressure right after giving iv fentanly for some cramps. Dr Brayton Caves informed , dr Brayton Caves talkes to patient . Pt at this time states starting to ease off.  14:47 pt states chest pressure better . Blood pressure 115/71. Margarita Mail rn

## 2011-07-25 NOTE — Anesthesia Postprocedure Evaluation (Signed)
Anesthesia Post Note  Patient: Angela Riley  Procedure(s) Performed: Procedure(s) (LRB): DILATATION AND EVACUATION (N/A)  Anesthesia type: MAC  Patient location: PACU  Post pain: Pain level controlled  Post assessment: Post-op Vital signs reviewed  Last Vitals:  Filed Vitals:   07/25/11 1110  BP: 104/57  Pulse: 68  Temp: 36.8 C  Resp: 18    Post vital signs: Reviewed  Level of consciousness: sedated  Complications: No apparent anesthesia complications

## 2011-07-25 NOTE — Progress Notes (Signed)
No bleeding No changes to H&P per patient history Reviewed procedure with patient, risks All questions answered.

## 2011-07-26 ENCOUNTER — Encounter (HOSPITAL_COMMUNITY): Payer: Self-pay | Admitting: Obstetrics and Gynecology

## 2011-08-04 ENCOUNTER — Emergency Department (HOSPITAL_COMMUNITY)
Admission: EM | Admit: 2011-08-04 | Discharge: 2011-08-04 | Disposition: A | Discharge status: Home / Self Care | Payer: Managed Care, Other (non HMO) | Source: Home / Self Care | Attending: Emergency Medicine | Admitting: Emergency Medicine

## 2011-09-13 ENCOUNTER — Encounter (HOSPITAL_COMMUNITY): Payer: Self-pay | Admitting: *Deleted

## 2011-09-13 ENCOUNTER — Inpatient Hospital Stay (HOSPITAL_COMMUNITY)
Admission: AD | Admit: 2011-09-13 | Discharge: 2011-09-14 | Disposition: A | Discharge status: Home / Self Care | Payer: Managed Care, Other (non HMO) | Source: Ambulatory Visit | Attending: Obstetrics and Gynecology | Admitting: Obstetrics and Gynecology

## 2011-09-13 DIAGNOSIS — N9089 Other specified noninflammatory disorders of vulva and perineum: Secondary | ICD-10-CM

## 2011-09-13 DIAGNOSIS — N949 Unspecified condition associated with female genital organs and menstrual cycle: Secondary | ICD-10-CM | POA: Insufficient documentation

## 2011-09-13 NOTE — MAU Note (Signed)
Pt reports she has "sores" in vaginal area. First noticed them 2 days ago. States she was started on anitbiotics for a "bacterial" infection after being seen at Urgent care 3 days ago. LMP 08/22/2011

## 2011-09-14 ENCOUNTER — Encounter (HOSPITAL_COMMUNITY): Payer: Self-pay

## 2011-09-14 DIAGNOSIS — N9089 Other specified noninflammatory disorders of vulva and perineum: Secondary | ICD-10-CM

## 2011-09-14 LAB — URINALYSIS, ROUTINE W REFLEX MICROSCOPIC
Glucose, UA: NEGATIVE mg/dL
Nitrite: NEGATIVE
Specific Gravity, Urine: >1.03 — ABNORMAL HIGH (ref 1.005–1.030)
pH: 5.5 (ref 5.0–8.0)

## 2011-09-14 LAB — POCT PREGNANCY, URINE: Preg Test, Ur: NEGATIVE

## 2011-09-14 LAB — URINE MICROSCOPIC-ADD ON

## 2011-09-14 LAB — GC/CHLAMYDIA PROBE AMP, GENITAL
Chlamydia, DNA Probe: NEGATIVE
GC Probe Amp, Genital: NEGATIVE

## 2011-09-14 MED ORDER — VALACYCLOVIR HCL 500 MG PO TABS: ORAL_TABLET | ORAL | Status: DC

## 2011-09-14 MED ORDER — LIDOCAINE HCL 2 % EX GEL
Freq: Once | CUTANEOUS | Status: AC
  Administered 2011-09-14: 10 via TOPICAL
  Filled 2011-09-14: qty 20

## 2011-09-14 MED ORDER — IBUPROFEN 600 MG PO TABS: 600.0000 mg | ORAL_TABLET | Freq: Four times a day (QID) | ORAL | Status: AC | PRN

## 2011-09-14 NOTE — MAU Provider Note (Signed)
History     CSN: [id number]  Arrival date and time: 09/13/11 2314   Chief Complaint  Patient presents with  . Groin Swelling   HPI  She is a non-pregnant 21 year old G3P0030 who presents with multiple painful genital lesions that appeared 2 days ago.   She is currently being treated with metronidazole for presumed bacterial vaginosis but says the present lesions appeared after starting treatment.  She is sexually active but is unsure if her partner has been treated for any STIs recently.  ROS is negative for dysuria, vaginal bleeding or discharge, abdominal pain, fever, chills, headaches.     Past Medical History  Diagnosis Date  . Asthma   . GERD (gastroesophageal reflux disease)   . Chlamydia 01/2010  . BV (bacterial vaginosis)   . Urinary tract infection   . Anxiety   . Ovarian cyst     Past Surgical History  Procedure Date  . Induced abortion     x2  . Dilation and evacuation 07/25/2011    Procedure: DILATATION AND EVACUATION;  Surgeon: Leslie Andrea, MD;  Location: [facility];  Service: Gynecology;  Laterality: N/A;  . Dilation and curettage of uterus     Family History  Problem Relation Age of Onset  . Anesthesia problems Neg Hx   . Other Neg Hx     History  Substance Use Topics  . Smoking status: Never Smoker   . Smokeless tobacco: Not on file  . Alcohol Use: No    Allergies: No Known Allergies  Prescriptions prior to admission  Medication Sig Dispense Refill  . metroNIDAZOLE (FLAGYL) 500 MG tablet Take 500 mg by mouth 2 (two) times daily.      Marland Kitchen DISCONTD: methylergonovine (METHERGINE) 0.2 MG tablet Take 1 tablet (0.2 mg total) by mouth every 6 (six) hours.  6 tablet  0  . DISCONTD: Prenatal Vit-Fe Fumarate-FA (PRENATAL MULTIVITAMIN) TABS Take 1 tablet by mouth daily.        Review of Systems  Constitutional: Negative for fever and chills.  Gastrointestinal: Negative for nausea and vomiting.  Genitourinary: Negative for dysuria, urgency, hematuria  and flank pain.   Physical Exam   Blood pressure 100/58, pulse 75, temperature 98.8 F (37.1 C), temperature source Oral, resp. rate 16, height 5\' 5"  (1.651 m), weight 66.225 kg (146 lb), last menstrual period 08/22/2011, SpO2 100.00%, unknown if currently breastfeeding.  Physical Exam  Constitutional: She appears well-nourished. No distress.  GI: Soft. She exhibits no distension. There is no tenderness.  Genitourinary: Vagina normal and uterus normal. Pelvic exam was performed with patient supine. There is lesion (multiple pustular lesions) on the right labia. Cervix exhibits discharge. Cervix exhibits no motion tenderness and no friability. No tenderness or bleeding around the vagina. No vaginal discharge found.   Results for orders placed during the hospital encounter of 09/13/11 (from the past 24 hour(s))  URINALYSIS, ROUTINE W REFLEX MICROSCOPIC     Status: Abnormal   Collection Time   09/13/11 11:55 PM      Component Value Range   Color, Urine YELLOW  YELLOW   APPearance CLEAR  CLEAR   Specific Gravity, Urine >1.030 (*) 1.005 - 1.030   pH 5.5  5.0 - 8.0   Glucose, UA NEGATIVE  NEGATIVE mg/dL   Hgb urine dipstick NEGATIVE  NEGATIVE   Bilirubin Urine NEGATIVE  NEGATIVE   Ketones, ur NEGATIVE  NEGATIVE mg/dL   Protein, ur NEGATIVE  NEGATIVE mg/dL   Urobilinogen, UA 0.2  0.0 - 1.0 mg/dL   Nitrite NEGATIVE  NEGATIVE   Leukocytes, UA TRACE (*) NEGATIVE  URINE MICROSCOPIC-ADD ON     Status: Abnormal   Collection Time   09/13/11 11:55 PM      Component Value Range   Squamous Epithelial / LPF FEW (*) RARE   WBC, UA 0-2  <3 WBC/hpf   RBC / HPF 3-6  <3 RBC/hpf   Bacteria, UA FEW (*) RARE  POCT PREGNANCY, URINE     Status: Normal   Collection Time   09/14/11 12:01 AM      Component Value Range   Preg Test, Ur NEGATIVE  NEGATIVE  WET PREP, GENITAL     Status: Abnormal   Collection Time   09/14/11  2:15 AM      Component Value Range   Yeast Wet Prep HPF POC NONE SEEN  NONE SEEN    Trich, Wet Prep NONE SEEN  NONE SEEN   Clue Cells Wet Prep HPF POC FEW (*) NONE SEEN   WBC, Wet Prep HPF POC FEW (*) NONE SEEN   MAU Course  Procedures Urine HCG Urinalysis Wet prep and GC/CT cultures Viral cultures  Assessment and Plan   1. Vulvar lesion    Plan: Treat with valacyclovir for suspected HSV lesions  Encourage her to follow up with PCP for complete STI workup   HOYLE, JASON 09/14/2011, 2:51 AM   I was present for the exam and agree with above.  [place], [place] 09/14/2011 5:31 AM

## 2011-09-14 NOTE — MAU Provider Note (Signed)
Attestation of Attending Supervision of Advanced Practitioner: Evaluation and management procedures were performed by the PA/NP/CNM/OB Fellow under my supervision/collaboration. Chart reviewed and agree with management and plan.  Lathan Gieselman V 09/14/2011 5:53 AM

## 2011-09-15 LAB — HERPES SIMPLEX VIRUS CULTURE: Culture: DETECTED

## 2011-09-16 ENCOUNTER — Telehealth (HOSPITAL_COMMUNITY): Payer: Self-pay | Admitting: Nurse Practitioner

## 2011-09-16 NOTE — Telephone Encounter (Signed)
Given lab results - HSV type 1.  Advised to be seen at the [facility]D clinic to get medication for future outbreaks.  Advised can transmit to partner prior to and during outbreaks.  Client agreed to follow up at STD clinic.

## 2011-09-16 NOTE — Telephone Encounter (Signed)
HSV culture reviewed - HSV Type 1.  Client needs to be notified.  Left message to call MAU and talk with Nurse Practitioner.

## 2011-09-26 ENCOUNTER — Telehealth: Payer: Self-pay | Admitting: *Deleted

## 2011-09-26 NOTE — Telephone Encounter (Signed)
Called pt and informed her of test results and to continue the Valtrex as directed.  Pt voiced understanding and had no questions.

## 2011-09-26 NOTE — Telephone Encounter (Signed)
Message copied by Jill Side on Tue Sep 26, 2011 11:09 AM ------      Message from: [place], [place]      Created: Mon Sep 25, 2011  4:36 PM       Please inform pt at pos HSV I Culture. Continue Valtrex as directed.

## 2011-11-17 ENCOUNTER — Inpatient Hospital Stay (HOSPITAL_COMMUNITY): Payer: Managed Care, Other (non HMO)

## 2011-11-17 ENCOUNTER — Encounter (HOSPITAL_COMMUNITY): Payer: Self-pay

## 2011-11-17 ENCOUNTER — Inpatient Hospital Stay (HOSPITAL_COMMUNITY)
Admission: AD | Admit: 2011-11-17 | Discharge: 2011-11-17 | Disposition: A | Discharge status: Home / Self Care | Payer: Managed Care, Other (non HMO) | Source: Ambulatory Visit | Attending: Family Medicine | Admitting: Family Medicine

## 2011-11-17 DIAGNOSIS — O99891 Other specified diseases and conditions complicating pregnancy: Secondary | ICD-10-CM | POA: Insufficient documentation

## 2011-11-17 DIAGNOSIS — Z3490 Encounter for supervision of normal pregnancy, unspecified, unspecified trimester: Secondary | ICD-10-CM

## 2011-11-17 DIAGNOSIS — Z1389 Encounter for screening for other disorder: Secondary | ICD-10-CM

## 2011-11-17 DIAGNOSIS — K5900 Constipation, unspecified: Secondary | ICD-10-CM | POA: Insufficient documentation

## 2011-11-17 DIAGNOSIS — O210 Mild hyperemesis gravidarum: Secondary | ICD-10-CM | POA: Insufficient documentation

## 2011-11-17 DIAGNOSIS — R109 Unspecified abdominal pain: Secondary | ICD-10-CM | POA: Insufficient documentation

## 2011-11-17 DIAGNOSIS — O219 Vomiting of pregnancy, unspecified: Secondary | ICD-10-CM

## 2011-11-17 LAB — URINALYSIS, ROUTINE W REFLEX MICROSCOPIC
Bilirubin Urine: NEGATIVE
Glucose, UA: NEGATIVE mg/dL
Hgb urine dipstick: NEGATIVE
Ketones, ur: NEGATIVE mg/dL
Leukocytes, UA: NEGATIVE
Nitrite: NEGATIVE
Protein, ur: NEGATIVE mg/dL
Specific Gravity, Urine: 1.025 (ref 1.005–1.030)
Urobilinogen, UA: 0.2 mg/dL (ref 0.0–1.0)
pH: 6 (ref 5.0–8.0)

## 2011-11-17 LAB — WET PREP, GENITAL
Clue Cells Wet Prep HPF POC: NONE SEEN
Trich, Wet Prep: NONE SEEN
Yeast Wet Prep HPF POC: NONE SEEN

## 2011-11-17 LAB — HCG, QUANTITATIVE, PREGNANCY: hCG, Beta Chain, Quant, S: 11094 m[IU]/mL — ABNORMAL HIGH

## 2011-11-17 MED ORDER — ONDANSETRON 4 MG PO TBDP: 4.0000 mg | ORAL_TABLET | Freq: Four times a day (QID) | ORAL | Status: DC | PRN

## 2011-11-17 MED ORDER — DOCUSATE SODIUM 100 MG PO CAPS: 100.0000 mg | ORAL_CAPSULE | Freq: Two times a day (BID) | ORAL | Status: DC | PRN

## 2011-11-17 NOTE — MAU Note (Signed)
Patient states she had a positive home pregnancy test on 10-4. Started having lower abdominal cramping last night. Denies any bleeding or discharge.

## 2011-11-17 NOTE — MAU Provider Note (Signed)
Chief Complaint: Possible Pregnancy and Abdominal Cramping   None    SUBJECTIVE HPI: Angela Riley is a 21 y.o. G4P0030 at [redacted]w[redacted]d by LMP who presents to maternity admissions reporting positive home pregnancy test and abdominal pain/cramping x2 days.  She denies vaginal bleeding, vaginal itching/burning, urinary symptoms, h/a, dizziness, n/v, or fever/chills.     Past Medical History  Diagnosis Date  . Asthma   . GERD (gastroesophageal reflux disease)   . Chlamydia 01/2010  . BV (bacterial vaginosis)   . Urinary tract infection   . Anxiety   . Ovarian cyst    Past Surgical History  Procedure Date  . Induced abortion     x2  . Dilation and evacuation 07/25/2011    Procedure: DILATATION AND EVACUATION;  Surgeon: Leslie Andrea, MD;  Location: [facility];  Service: Gynecology;  Laterality: N/A;  . Dilation and curettage of uterus    History   Social History  . Marital Status: Single    Spouse Name: N/A    Number of Children: N/A  . Years of Education: N/A   Occupational History  . Not on file.   Social History Main Topics  . Smoking status: Never Smoker   . Smokeless tobacco: Not on file  . Alcohol Use: No  . Drug Use: No  . Sexually Active: Yes    Birth Control/ Protection: None     1 partner   Other Topics Concern  . Not on file   Social History Narrative  . No narrative on file   No current facility-administered medications on file prior to encounter.   Current Outpatient Prescriptions on File Prior to Encounter  Medication Sig Dispense Refill  . metroNIDAZOLE (FLAGYL) 500 MG tablet Take 500 mg by mouth 2 (two) times daily.      . valACYclovir (VALTREX) 500 MG tablet Take 1000 mg (2 tablets) every 12 hours for 7-10 days for first outbreak. For recurrences, take 500 mg (1 tablet) every 12 hours for 3 days.  60 tablet  6   No Known Allergies  ROS: Pertinent items in HPI  OBJECTIVE Blood pressure 134/64, pulse 82, temperature 98.8 F (37.1 C), temperature  source Oral, resp. rate 16, height 5\' 5"  (1.651 m), weight 66.497 kg (146 lb 9.6 oz), last menstrual period 10/11/2011, SpO2 100.00%. GENERAL: Well-developed, well-nourished female in no acute distress.  HEENT: Normocephalic HEART: normal rate RESP: normal effort ABDOMEN: Soft, non-tender EXTREMITIES: Nontender, no edema NEURO: Alert and oriented SPECULUM EXAM  LAB RESULTS  Recent Results (from the past 168 hour(s))  URINALYSIS, ROUTINE W REFLEX MICROSCOPIC   Collection Time   11/17/11  2:22 PM      Component Value Range   Color, Urine YELLOW  YELLOW   APPearance CLEAR  CLEAR   Specific Gravity, Urine 1.025  1.005 - 1.030   pH 6.0  5.0 - 8.0   Glucose, UA NEGATIVE  NEGATIVE mg/dL   Hgb urine dipstick NEGATIVE  NEGATIVE   Bilirubin Urine NEGATIVE  NEGATIVE   Ketones, ur NEGATIVE  NEGATIVE mg/dL   Protein, ur NEGATIVE  NEGATIVE mg/dL   Urobilinogen, UA 0.2  0.0 - 1.0 mg/dL   Nitrite NEGATIVE  NEGATIVE   Leukocytes, UA NEGATIVE  NEGATIVE  POCT PREGNANCY, URINE   Collection Time   11/17/11  3:03 PM      Component Value Range   Preg Test, Ur POSITIVE (*) NEGATIVE  HCG, QUANTITATIVE, PREGNANCY   Collection Time   11/17/11  3:45 PM  Component Value Range   hCG, Beta Chain, [place] [address] (*) <5 mIU/mL  GC/CHLAMYDIA PROBE AMP, GENITAL   Collection Time   11/17/11  4:20 PM      Component Value Range   GC Probe Amp, Genital NEGATIVE  NEGATIVE   Chlamydia, DNA Probe NEGATIVE  NEGATIVE  WET PREP, GENITAL   Collection Time   11/17/11  4:20 PM      Component Value Range   Yeast Wet Prep HPF POC NONE SEEN  NONE SEEN   Trich, Wet Prep NONE SEEN  NONE SEEN   Clue Cells Wet Prep HPF POC NONE SEEN  NONE SEEN   WBC, Wet Prep HPF POC FEW (*) NONE SEEN     IMAGING   ASSESSMENT 1. Normal IUP (intrauterine pregnancy) on prenatal ultrasound   2. Nausea/vomiting in pregnancy   3. Constipation in pregnancy     PLAN Discharge home Zofran 4 mg ODT Q6 hours per pt request (pt  does not want Phenergan) Colace 100 mg BID Miralax PRN F/U with early prenatal care Return to MAU as needed    Medication List     As of 11/20/2011  8:19 AM    TAKE these medications         docusate sodium 100 MG capsule   Commonly known as: COLACE   Take 1 capsule (100 mg total) by mouth 2 (two) times daily as needed for constipation.      ondansetron 4 MG disintegrating tablet   Commonly known as: ZOFRAN-ODT   Take 1 tablet (4 mg total) by mouth every 6 (six) hours as needed for nausea.      prenatal multivitamin Tabs   Take 1 tablet by mouth every morning.          Sharen Counter Certified Nurse-Midwife 11/17/2011  4:04 PM

## 2011-11-20 NOTE — MAU Provider Note (Signed)
Chart reviewed and agree with management and plan.  

## 2012-09-21 ENCOUNTER — Encounter (HOSPITAL_COMMUNITY): Payer: Self-pay | Admitting: *Deleted
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
## 2013-08-12 NOTE — Patient Instructions (Addendum)
Please contact our office if you have any questions about your visit today.    HPV (Human Papillomavirus) Vaccine Gardasil??: What You Need to Know  What is HPV?  Genital human papillomavirus (HPV) is the most common sexually transmitted virus in the [place]. More than half of sexually active men and women are infected with HPV at some time in their lives.  About 20 million Americans are currently infected, and about 6 million more get infected each year. HPV is usually spread through sexual contact.  Most HPV infections don't cause any symptoms, and go away on their own. But HPV can cause cervical cancer in women. Cervical cancer is the 2nd leading cause of cancer deaths among women around the world. In the [place], about 12,000 women get cervical cancer every year and about 4,000 are expected to die from it.  HPV is also associated with several less common cancers, such as vaginal and vulvar cancers in women, and anal and oropharyngeal (back of the throat, including base of tongue and tonsils) cancers in both men and women. HPV can also cause genital warts and warts in the throat.  There is no cure for HPV infection, but some of the problems it causes can be treated.  HPV vaccine???Why get vaccinated?  The HPV vaccine you are getting is one of two vaccines that can be given to prevent HPV. It may be given to both males and females.  This vaccine can prevent most cases of cervical cancer in females, if it is given before exposure to the virus. In addition, it can prevent vaginal and vulvar cancer in females, and genital warts and anal cancer in both males and females.  Protection from HPV vaccine is expected to be long-lasting. But vaccination is not a substitute for cervical cancer screening. Women should still get regular Pap tests.  Who should get this HPV vaccine and when?  HPV vaccine is given as a 3-dose series  ?? 1st Dose: Now  ?? 2nd Dose: 1 to 2 months after Dose 1   ?? 3rd Dose: 6 months after Dose 1  Additional (booster) doses are not recommended.  Routine vaccination  ?? This HPV vaccine is recommended for girls and boys 6311 or 23 years of age. It may be given starting at age 419.  Why is HPV vaccine recommended at 2711 or 23 years of age? HPV infection is easily acquired, even with only one sex partner. That is why it is important to get HPV vaccine before any sexual contact takes place. Also, response to the vaccine is better at this age than at older ages.  Catch-up vaccination  This vaccine is recommended for the following people who have not completed the 3-dose series:  ?? Females 13 through 23 years of age  ?? Males 13 through 23 years of age  This vaccine may be given to men 9622 through 23 years of age who have not completed the 3-dose series.  It is recommended for men through age 23 who have sex with men or whose immune system is weakened because of HIV infection, other illness, or medications.  HPV vaccine may be given at the same time as other vaccines.  Some people should not get HPV vaccine or should wait  ?? Anyone who has ever had a life-threatening allergic reaction to any component of HPV vaccine, or to a previous dose of HPV vaccine, should not get the vaccine. Tell your doctor if the person getting vaccinated has any  severe allergies, including an allergy to yeast.  ?? HPV vaccine is not recommended for pregnant women. However, receiving HPV vaccine when pregnant is not a reason to consider terminating the pregnancy. Women who are breast feeding may get the vaccine.  ?? People who are mildly ill when a dose of HPV vaccine is planned can still be vaccinated. People with a moderate or severe illness should wait until they are better.  What are the risks from this vaccine?  This HPV vaccine has been used in the [place] and around the world for about six years and has been very safe.  However, any medicine could possibly cause a serious problem, such as a  severe allergic reaction. The risk of any vaccine causing a serious injury, or death, is extremely small.  Life-threatening allergic reactions from vaccines are very rare. If they do occur, it would be within a few minutes to a few hours after the vaccination.  Several mild to moderate problems are known to occur with this HPV vaccine. These do not last long and go away on their own.  ?? Reactions in the arm where the shot was given:  ?? Pain (about 8 people in 10)  ?? Redness or swelling (about 1 person in 4)  ?? Fever  ?? Mild (100??F) (about 1 person in 5010)  ?? Moderate (102??F) (about 1 person in 6365)  ?? Other problems:  ?? Headache (about 1 person in 3)  ?? Fainting: Brief fainting spells and related symptoms (such as jerking movements) can happen after any medical procedure, including vaccination. Sitting or lying down for about 15 minutes after a vaccination can help prevent fainting and injuries caused by falls. Tell your doctor if the patient feels dizzy or light-headed, or has vision changes or ringing in the ears.  Like all vaccines, HPV vaccines will continue to be monitored for unusual or severe problems.  What if there is a serious reaction?  What should I look for?  ?? Look for anything that concerns you, such as signs of a severe allergic reaction, very high fever, or behavior changes.  Signs of a severe allergic reaction can include hives, swelling of the face and throat, difficulty breathing, a fast heartbeat, dizziness, and weakness. These would start a few minutes to a few hours after the vaccination.  What should I do?  ?? If you think it is a severe allergic reaction or other emergency that can't wait, call 9-1-1 or get the person to the nearest hospital. Otherwise, call your doctor.  ?? Afterward, the reaction should be reported to the Vaccine Adverse Event Reporting System (VAERS). Your doctor might file this report, or you can do it yourself through the VAERS web site at www.vaers.LAgents.nohhs.gov, or by  calling 1-269 806 8566.  VAERS is only for reporting reactions. They do not give medical advice.  The National Vaccine Injury Compensation Program  The National Vaccine Injury Compensation Program (VICP) is a federal program that was created to compensate people who may have been injured by certain vaccines.  Persons who believe they may have been injured by a vaccine can learn about the program and about filing a claim by calling 1-602-064-1003 or visiting the VICP website at SpiritualWord.atwww.hrsa.gov/vaccinecompensation.  How can I learn more?  ?? Ask your doctor.  ?? Call your local or state health department.  ?? Contact the Centers for Disease Control and Prevention (CDC):  ?? Call 825-078-59991-(463)250-3067 (1-800-CDC-INFO) or  ?? Visit the CDC's website at PicCapture.uywww.cdc.gov/vaccines.  Vaccine Information  Statement (Interim)  HPV Vaccine (Gardasil)  (06/23/2011)  42 [place] ?? [phone] [id number]  Department of Health and Insurance risk surveyorHuman Services  Centers for Disease Control and Prevention  Many Vaccine Information Statements are available in Spanish and other languages. See PromoAge.com.brwww.immunize.org/vis.  Muchas hojas de informaci??n sobre vacunas est??n disponibles en espa??ol y en otros idiomas. Visite PromoAge.com.brwww.immunize.org/vis.  Content Version: 10.5.422740

## 2013-08-12 NOTE — Progress Notes (Signed)
Chief Complaint   Patient presents with   ??? Establish Care

## 2013-08-13 NOTE — Progress Notes (Signed)
HISTORY OF PRESENT ILLNESS  Megan Ewing is a 23 y.o. female.  HPI  Chief Complaint   Patient presents with   ??? Establish Care   [place] 23 [place], with 23 year old daughter. Doing well, feels well. She does not recall having her Gardisil vaccinations. No recent PAP since birth of daughter. Mother with HTN  History reviewed. No pertinent past medical history.  No current outpatient prescriptions on file prior to visit.     No current facility-administered medications on file prior to visit.     No Known Allergies    Review of Systems   Constitutional: Negative.    HENT: Negative.    Eyes: Negative.    Respiratory: Negative.    Cardiovascular: Negative.    Neurological: Negative.    Psychiatric/Behavioral: Negative.      BP 101/61 mmHg   Pulse 78   Temp(Src) 98.3 ??F (36.8 ??C) (Oral)   Resp 16   Ht 5' 5.5" (1.664 m)   Wt 176 lb 12 oz (80.173 kg)   BMI 28.95 kg/m2   LMP 07/13/2013 (Approximate)    Physical Exam   Constitutional: She is oriented to person, place, and time. She appears well-developed and well-nourished. No distress.   Eyes: Pupils are equal, round, and reactive to light.   Neck: Normal range of motion. Neck supple. No thyromegaly present.   Cardiovascular: Normal rate, regular rhythm and normal heart sounds.  Exam reveals no gallop and no friction rub.    No murmur heard.  Pulmonary/Chest: Effort normal and breath sounds normal. No respiratory distress. She has no wheezes.   Musculoskeletal: She exhibits no edema.   Lymphadenopathy:     She has no cervical adenopathy.   Neurological: She is alert and oriented to person, place, and time.   Psychiatric: She has a normal mood and affect. Her behavior is normal. Judgment and thought content normal.   Nursing note and vitals reviewed.      ASSESSMENT and PLAN    ICD-9-CM    1. Family history of type 2 diabetes mellitus V18.0    pt advised to f/u for CPE with PAP   Follow-up Disposition:   Return if symptoms worsen or fail to improve.

## 2013-12-08 ENCOUNTER — Encounter (HOSPITAL_COMMUNITY): Payer: Self-pay | Admitting: *Deleted

## 2014-03-13 ENCOUNTER — Emergency Department (HOSPITAL_COMMUNITY)
Admission: EM | Admit: 2014-03-13 | Discharge: 2014-03-13 | Disposition: A | Discharge status: Home / Self Care | Payer: Medicaid Other | Attending: Emergency Medicine | Admitting: Emergency Medicine

## 2014-03-13 ENCOUNTER — Encounter (HOSPITAL_COMMUNITY): Payer: Self-pay | Admitting: Family Medicine

## 2014-03-13 DIAGNOSIS — Z87440 Personal history of urinary (tract) infections: Secondary | ICD-10-CM | POA: Insufficient documentation

## 2014-03-13 DIAGNOSIS — A084 Viral intestinal infection, unspecified: Secondary | ICD-10-CM | POA: Insufficient documentation

## 2014-03-13 DIAGNOSIS — J45909 Unspecified asthma, uncomplicated: Secondary | ICD-10-CM | POA: Insufficient documentation

## 2014-03-13 DIAGNOSIS — Z8719 Personal history of other diseases of the digestive system: Secondary | ICD-10-CM | POA: Insufficient documentation

## 2014-03-13 DIAGNOSIS — Z8659 Personal history of other mental and behavioral disorders: Secondary | ICD-10-CM | POA: Insufficient documentation

## 2014-03-13 DIAGNOSIS — Z8619 Personal history of other infectious and parasitic diseases: Secondary | ICD-10-CM | POA: Diagnosis not present

## 2014-03-13 DIAGNOSIS — R1110 Vomiting, unspecified: Secondary | ICD-10-CM | POA: Diagnosis present

## 2014-03-13 DIAGNOSIS — Z8742 Personal history of other diseases of the female genital tract: Secondary | ICD-10-CM | POA: Diagnosis not present

## 2014-03-13 NOTE — ED Provider Notes (Signed)
CSN: [id number]     Arrival date & time 03/13/14  1515 History  This chart was scribed for Felicie Morn, NP working with Gilda Crease, * by Evon Slack, ED Scribe. This patient was seen in room TR11C/TR11C and the patient's care was started at 4:03 PM.      Chief Complaint  Patient presents with  . Emesis   Patient is a 24 y.o. female presenting with vomiting. The history is provided by the patient. No language interpreter was used.  Emesis Associated symptoms: no abdominal pain and no chills    HPI Comments: Angela Riley is a 24 y.o. female who presents to the Emergency Department complaining of new resolved vomiting onset 1 day prior. Pt states she ha shad associated diarrhea . Pt states she has recently been her daughter with similar symptoms. Pt denies abdominal pain, fever or chills.   Past Medical History  Diagnosis Date  . Asthma   . GERD (gastroesophageal reflux disease)   . Chlamydia 01/2010  . BV (bacterial vaginosis)   . Urinary tract infection   . Anxiety   . Ovarian cyst    Past Surgical History  Procedure Laterality Date  . Induced abortion      x2  . Dilation and evacuation  07/25/2011    Procedure: DILATATION AND EVACUATION;  Surgeon: Leslie Andrea, MD;  Location: [facility];  Service: Gynecology;  Laterality: N/A;  . Dilation and curettage of uterus     Family History  Problem Relation Age of Onset  . Anesthesia problems Neg Hx   . Other Neg Hx    History  Substance Use Topics  . Smoking status: Never Smoker   . Smokeless tobacco: Not on file  . Alcohol Use: No   OB History    Gravida Para Term Preterm AB TAB SAB Ectopic Multiple Living        Review of Systems  Constitutional: Negative for fever and chills.  Gastrointestinal: Positive for vomiting. Negative for nausea and abdominal pain.  All other systems reviewed and are negative.    Allergies  Review of patient's allergies indicates no known  allergies.  Home Medications   Prior to Admission medications   Medication Sig Start Date End Date Taking? Authorizing Provider  docusate sodium (COLACE) 100 MG capsule Take 1 capsule (100 mg total) by mouth 2 (two) times daily as needed for constipation. 11/17/11   Lisa A Leftwich-Kirby, CNM  ondansetron (ZOFRAN ODT) 4 MG disintegrating tablet Take 1 tablet (4 mg total) by mouth every 6 (six) hours as needed for nausea. 11/17/11   Hurshel Party, CNM  Prenatal Vit-Fe Fumarate-FA (PRENATAL MULTIVITAMIN) TABS Take 1 tablet by mouth every morning.    Historical Provider, MD   BP 95/75 mmHg  Pulse 88  Temp(Src) 98.2 F (36.8 C) (Oral)  Resp 18  SpO2 96%  LMP 03/13/2014   Physical Exam  Constitutional: She is oriented to person, place, and time. She appears well-developed and well-nourished. No distress.  HENT:  Head: Normocephalic and atraumatic.  Eyes: Conjunctivae and EOM are normal.  Neck: Neck supple. No tracheal deviation present.  Cardiovascular: Normal rate.   Pulmonary/Chest: Effort normal and breath sounds normal. No respiratory distress.  Abdominal: Soft. There is no tenderness.  Musculoskeletal: Normal range of motion.  Neurological: She is alert and oriented to person, place, and time.  Skin: Skin is warm and dry.  Psychiatric: She has a  normal mood and affect. Her behavior is normal.  Nursing note and vitals reviewed.   ED Course  Procedures (including critical care time) DIAGNOSTIC STUDIES: Oxygen Saturation is 96% on RA, adequate by my interpretation.    COORDINATION OF CARE: 4:06 PM-Discussed treatment plan with pt at bedside and pt agreed to plan.     Labs Review Labs Reviewed - No data to display  Imaging Review No results found.   EKG Interpretation None    Patient reports 24 hours of nausea, vomiting, diarrhea--all symptoms currently resolved.  Is tolerating po fluids without difficulty.  Last episodes of vomiting and diarrhea this morning.   No fever, chills, abdominal pain.  Has a one year old at home that experienced same symptoms one day prior to onset of patient's symptoms--child is also currently symptom-free. MDM   Final diagnoses:  None   Viral gastroenteritis.   I personally performed the services described in this documentation, which was scribed in my presence. The recorded information has been reviewed and is accurate.      Jimmye Normanavid John Cassidey Barrales, NP 03/13/14 [id number]  Gilda Creasehristopher J. Pollina, MD 03/14/14 [phone]

## 2014-03-13 NOTE — Discharge Instructions (Signed)

## 2014-03-13 NOTE — ED Notes (Signed)
Per pt sts vomiting that started last night. sts she is better today and hasn't vomited since 6 am and has been holding down fluids. sts that her daughter was sick too but better. sts that she needs to be cleared to assure she can go back to work and whatever she has is not contagious. Denies nausea currently.

## 2014-12-02 ENCOUNTER — Telehealth: Payer: Self-pay | Admitting: *Deleted

## 2014-12-02 NOTE — Telephone Encounter (Signed)
Patient is interested in scheduling an appointment to have her Nexplanon removed and a new one inserted.  Attempted to contact the patient and left message for patient to call the office.

## 2014-12-18 ENCOUNTER — Ambulatory Visit (INDEPENDENT_AMBULATORY_CARE_PROVIDER_SITE_OTHER): Payer: Medicaid Other | Admitting: Certified Nurse Midwife

## 2014-12-18 ENCOUNTER — Encounter: Payer: Self-pay | Admitting: Certified Nurse Midwife

## 2014-12-18 VITALS — BP 106/63 | HR 71 | Temp 98.7°F | Ht 65.0 in | Wt 169.0 lb

## 2014-12-18 DIAGNOSIS — L700 Acne vulgaris: Secondary | ICD-10-CM

## 2014-12-18 DIAGNOSIS — Z01419 Encounter for gynecological examination (general) (routine) without abnormal findings: Secondary | ICD-10-CM

## 2014-12-18 DIAGNOSIS — Z113 Encounter for screening for infections with a predominantly sexual mode of transmission: Secondary | ICD-10-CM

## 2014-12-18 DIAGNOSIS — Z0000 Encounter for general adult medical examination without abnormal findings: Secondary | ICD-10-CM

## 2014-12-18 LAB — CBC WITH DIFFERENTIAL/PLATELET
BASOS ABS: 0 10*3/uL (ref 0.0–0.1)
Basophils Relative: 0 % (ref 0–1)
Eosinophils Absolute: 0.3 10*3/uL (ref 0.0–0.7)
Eosinophils Relative: 3 % (ref 0–5)
HEMATOCRIT: 40.2 % (ref 36.0–46.0)
HEMOGLOBIN: 13.6 g/dL (ref 12.0–15.0)
LYMPHS ABS: 2.1 10*3/uL (ref 0.7–4.0)
LYMPHS PCT: 23 % (ref 12–46)
MCH: 28.9 pg (ref 26.0–34.0)
MCHC: 33.8 g/dL (ref 30.0–36.0)
MCV: 85.4 fL (ref 78.0–100.0)
MONO ABS: 0.7 10*3/uL (ref 0.1–1.0)
MPV: 10.6 fL (ref 8.6–12.4)
Monocytes Relative: 7 % (ref 3–12)
NEUTROS ABS: 6.2 10*3/uL (ref 1.7–7.7)
Neutrophils Relative %: 67 % (ref 43–77)
Platelets: 228 10*3/uL (ref 150–400)
RBC: 4.71 MIL/uL (ref 3.87–5.11)
RDW: 14.3 % (ref 11.5–15.5)
WBC: 9.3 10*3/uL (ref 4.0–10.5)

## 2014-12-18 LAB — RPR

## 2014-12-18 LAB — COMPREHENSIVE METABOLIC PANEL
ALBUMIN: 4 g/dL (ref 3.6–5.1)
ALK PHOS: 83 U/L (ref 33–115)
ALT: 6 U/L (ref 6–29)
AST: 17 U/L (ref 10–30)
BILIRUBIN TOTAL: 0.3 mg/dL (ref 0.2–1.2)
BUN: 9 mg/dL (ref 7–25)
CALCIUM: 9 mg/dL (ref 8.6–10.2)
CO2: 24 mmol/L (ref 20–31)
Chloride: 109 mmol/L (ref 98–110)
Creat: 0.71 mg/dL (ref 0.50–1.10)
Glucose, Bld: 79 mg/dL (ref 65–99)
POTASSIUM: 3.7 mmol/L (ref 3.5–5.3)
Sodium: 143 mmol/L (ref 135–146)
Total Protein: 7.3 g/dL (ref 6.1–8.1)

## 2014-12-18 NOTE — Patient Instructions (Signed)
Acne Acne is a skin problem that causes pimples. Acne occurs when the pores in the skin get blocked. The pores may become infected with bacteria, or they may become red, sore, and swollen. Acne is a common skin problem, especially for teenagers. Acne usually goes away over time. CAUSES Each pore contains an oil gland. Oil glands make an oily substance that is called sebum. Acne happens when these glands get plugged with sebum, dead skin cells, and dirt. Then, the bacteria that are normally found in the oil glands multiply and cause inflammation. Acne is commonly triggered by changes in your hormones. These hormonal changes can cause the oil glands to get bigger and to make more sebum. Factors that can make acne worse include:  Hormone changes during:  Adolescence.  Women's menstrual cycles.  Pregnancy.  Oil-based cosmetics and hair products.  Harshly scrubbing the skin.  Strong soaps.  Stress.  Hormone problems that are due to certain diseases.  Long or oily hair rubbing against the skin.  Certain medicines.  Pressure from headbands, backpacks, or shoulder pads.  Exposure to certain oils and chemicals. RISK FACTORS This condition is more likely to develop in:  Teenagers.  People who have a family history of acne. SYMPTOMS Acne often occurs on the face, neck, chest, and upper back. Symptoms include:  Small, red bumps (pimples or papules).  Whiteheads.  Blackheads.  Small, pus-filled pimples (pustules).  Big, red pimples or pustules that feel tender. More severe acne can cause:  An infected area that contains a collection of pus (abscess).  Hard, painful, fluid-filled sacs (cysts).  Scars. DIAGNOSIS This condition is diagnosed with a medical history and physical exam. Blood tests may also be done. TREATMENT Treatment for this condition can vary depending on the severity of your acne. Treatment may include:  Creams and lotions that prevent oil glands from  clogging.  Creams and lotions that treat or prevent infections and inflammation.  Antibiotic medicines that are applied to the skin or taken as a pill.  Pills that decrease sebum production.  Birth control pills.  Light or laser treatments.  Surgery.  Injections of medicine into the affected areas.  Chemicals that cause peeling of the skin. Your health care provider will also recommend the best way to take care of your skin. Good skin care is the most important part of treatment. HOME CARE INSTRUCTIONS Skin Care Take care of your skin as told by your health care provider. You may be told to do these things:  Wash your skin gently at least two times each day, as well as:  After you exercise.  Before you go to bed.  Use mild soap.  Apply a water-based skin moisturizer after you wash your skin.  Use a sunscreen or sunblock with SPF 30 or greater. This is especially important if you are using acne medicines.  Choose cosmetics that will not plug your oil glands (are noncomedogenic). Medicines  Take over-the-counter and prescription medicines only as told by your health care provider.  If you were prescribed an antibiotic medicine, apply or take it as told by your health care provider. Do not stop taking the antibiotic even if your condition improves. General Instructions  Keep your hair clean and off of your face. If you have oily hair, shampoo your hair regularly or daily.  Avoid leaning your chin or forehead against your hands.  Avoid wearing tight headbands or hats.  Avoid picking or squeezing your pimples. That can make your acne worse   and cause scarring.  Keep all follow-up visits as told by your health care provider. This is important.  Shave gently and only when necessary.  Keep a food journal to figure out if any foods are linked with your acne. SEEK MEDICAL CARE IF:  Your acne is not better after eight weeks.  Your acne gets worse.  You have a large  area of skin that is red or tender.  You think that you are having side effects from any acne medicine.   This information is not intended to replace advice given to you by your health care provider. Make sure you discuss any questions you have with your health care provider.   Document Released: 01/21/2000 Document Revised: 10/14/2014 Document Reviewed: 04/01/2014 Elsevier Interactive Patient Education 2016 [employer]  

## 2014-12-18 NOTE — Progress Notes (Signed)
Patient ID: Angela Riley, female   DOB: 21-Nov-1990, 24 y.o.   MRN: [id number]    Subjective:        Angela Riley is a 24 y.o. female here for a routine exam.  Current complaints: none.  Has Nexplanon implant.  Arm pain, from working and young son.  Is having irregular periods with the Nexplanon.    Uses OTC products for her Acne, has had acne since middle school.  Has acne all the time.    Personal health questionnaire:  Is patient Ashkenazi Jewish, have a family history of breast and/or ovarian cancer: no Is there a family history of uterine cancer diagnosed at age < 51, gastrointestinal cancer, urinary tract cancer, family member who is a Personnel officer syndrome-associated carrier: no Is the patient overweight and hypertensive, family history of diabetes, personal history of gestational diabetes, preeclampsia or PCOS: no Is patient over 7, have PCOS,  family history of premature CHD under age 5, diabetes, smoke, have hypertension or peripheral artery disease:  MGM has DM At any time, has a partner hit, kicked or otherwise hurt or frightened you?: no Over the past 2 weeks, have you felt down, depressed or hopeless?: no Over the past 2 weeks, have you felt little interest or pleasure in doing things?:no   Gynecologic History No LMP recorded. Patient has had an implant. Contraception: Nexplanon Last Pap: unknown. Results were: normal according to the patient Last mammogram: N/A.   Obstetric History OB History  Gravida Para Term Preterm AB SAB TAB Ectopic Multiple Living     # Outcome Date GA Lbr Len/2nd Weight Sex Delivery Anes PTL Lv  4 SAB              Comments: System Generated. Please review and update pregnancy details.  3 TAB           2 TAB           1 Gravida              Comments: System Generated. Please review and update pregnancy details.      Past Medical History  Diagnosis Date  . Asthma   . GERD (gastroesophageal reflux disease)   .  Chlamydia 01/2010  . BV (bacterial vaginosis)   . Urinary tract infection   . Anxiety   . Ovarian cyst     Past Surgical History  Procedure Laterality Date  . Induced abortion      x2  . Dilation and evacuation  07/25/2011    Procedure: DILATATION AND EVACUATION;  Surgeon: Leslie Andrea, MD;  Location: [facility];  Service: Gynecology;  Laterality: N/A;  . Dilation and curettage of uterus      No current outpatient prescriptions on file. No Known Allergies  Social History  Substance Use Topics  . Smoking status: Never Smoker   . Smokeless tobacco: Never Used  . Alcohol Use: No    Family History  Problem Relation Age of Onset  . Anesthesia problems Neg Hx   . Other Neg Hx       Review of Systems  Constitutional: negative for fatigue and weight loss Respiratory: negative for cough and wheezing Cardiovascular: negative for chest pain, fatigue and palpitations Gastrointestinal: negative for abdominal pain and change in bowel habits Musculoskeletal:negative for myalgias Neurological: negative for gait problems and tremors Behavioral/Psych: negative for abusive relationship, depression Endocrine: negative for temperature intolerance   Genitourinary:negative for abnormal menstrual  periods, genital lesions, hot flashes, sexual problems and vaginal discharge Integument/breast: negative for breast lump, breast tenderness, nipple discharge and skin lesion(s)    Objective:       BP 106/63 mmHg  Pulse 71  Temp(Src) 98.7 F (37.1 C)  Ht 5\' 5"  (1.651 m)  Wt 169 lb (76.658 kg)  BMI 28.12 kg/m2 General:   alert  Skin:   no rash or abnormalities  Lungs:   clear to auscultation bilaterally  Heart:   regular rate and rhythm, S1, S2 normal, no murmur, click, rub or gallop  Breasts:   normal without suspicious masses, skin or nipple changes or axillary nodes  Abdomen:  normal findings: no organomegaly, soft, non-tender and no hernia  Pelvis:  External genitalia: normal general  appearance Urinary system: urethral meatus normal and bladder without fullness, nontender Vaginal: normal without tenderness, induration or masses Cervix: normal appearance Adnexa: normal bimanual exam Uterus: anteverted and non-tender, normal size   Lab Review Urine pregnancy test Labs reviewed yes Radiologic studies reviewed no  50% of 30 min visit spent on counseling and coordination of care.   Assessment:    Healthy female exam.   STD screening exam  Contraception management  Plan:    Education reviewed: depression evaluation, low fat, low cholesterol diet, safe sex/STD prevention, self breast exams, skin cancer screening and weight bearing exercise. Contraception: Nexplanon. Follow up in: 1 year for annual exam and Nexplanon removal/reinsertion.   No orders of the defined types were placed in this encounter.   Orders Placed This Encounter  Procedures  . SureSwab, Vaginosis/Vaginitis Plus    Possible management options include: another form of contraception Follow up as needed.

## 2014-12-18 NOTE — Telephone Encounter (Signed)
Patient is in need of an annual exam. Patient has been scheduled for a NP/AEX and will discuss and schedule the removal of the current nexplanon and the insertion of the new device.

## 2014-12-19 LAB — HEPATITIS B SURFACE ANTIGEN: HEP B S AG: NEGATIVE

## 2014-12-19 LAB — HIV ANTIBODY (ROUTINE TESTING W REFLEX): HIV: NONREACTIVE

## 2014-12-19 LAB — HEPATITIS C ANTIBODY: HCV Ab: NEGATIVE

## 2014-12-19 LAB — TSH: TSH: 1.47 u[IU]/mL (ref 0.350–4.500)

## 2014-12-22 LAB — PAP IG W/ RFLX HPV ASCU

## 2014-12-22 LAB — SURESWAB, VAGINOSIS/VAGINITIS PLUS
Atopobium vaginae: NOT DETECTED Log (cells/mL)
C. GLABRATA, DNA: NOT DETECTED
C. PARAPSILOSIS, DNA: NOT DETECTED
C. TROPICALIS, DNA: NOT DETECTED
C. albicans, DNA: NOT DETECTED
C. trachomatis RNA, TMA: NOT DETECTED
GARDNERELLA VAGINALIS: NOT DETECTED Log (cells/mL)
LACTOBACILLUS SPECIES: 6.9 Log (cells/mL)
MEGASPHAERA SPECIES: NOT DETECTED Log (cells/mL)
N. gonorrhoeae RNA, TMA: NOT DETECTED
T. VAGINALIS RNA, QL TMA: NOT DETECTED

## 2014-12-28 ENCOUNTER — Telehealth: Payer: Self-pay | Admitting: *Deleted

## 2014-12-28 NOTE — Telephone Encounter (Signed)
Patient has been scheduled for 01/05/15 @ 1:45 pm nexplanon removal.

## 2014-12-28 NOTE — Telephone Encounter (Signed)
Patient is interested in having her Nexplanon removed and changing birth control methods.  Attempted to contact the patient and left message for patient to contact the office.

## 2015-01-05 ENCOUNTER — Ambulatory Visit: Payer: Self-pay | Admitting: Certified Nurse Midwife

## 2015-01-06 ENCOUNTER — Ambulatory Visit (INDEPENDENT_AMBULATORY_CARE_PROVIDER_SITE_OTHER): Payer: Medicaid Other | Admitting: Certified Nurse Midwife

## 2015-01-06 ENCOUNTER — Encounter: Payer: Self-pay | Admitting: Certified Nurse Midwife

## 2015-01-06 VITALS — BP 105/67 | HR 81 | Temp 98.6°F | Wt 173.0 lb

## 2015-01-06 DIAGNOSIS — L739 Follicular disorder, unspecified: Secondary | ICD-10-CM

## 2015-01-06 DIAGNOSIS — N898 Other specified noninflammatory disorders of vagina: Secondary | ICD-10-CM | POA: Diagnosis not present

## 2015-01-06 MED ORDER — HYDROCORTISONE 1 % EX CREA: TOPICAL_CREAM | CUTANEOUS | Status: DC

## 2015-01-06 MED ORDER — TRIPLE ANTIBIOTIC 5-400-5000 EX OINT: TOPICAL_OINTMENT | Freq: Four times a day (QID) | CUTANEOUS | Status: DC

## 2015-01-06 NOTE — Progress Notes (Signed)
Patient ID: Angela Riley, female   DOB: September 26, 1990, 24 y.o.   MRN: [id number]   Chief Complaint  Patient presents with  . problem    vaginal irritation    HPI Angela Riley is a 24 y.o. female.  Here for itching, burning and slight pain in groin and vaginal area.  Started about a week ago.  States that it is not hard to sit down or walk.  States that she does shave her pubic hair.    HPI  Past Medical History  Diagnosis Date  . Asthma   . GERD (gastroesophageal reflux disease)   . Chlamydia 01/2010  . BV (bacterial vaginosis)   . Urinary tract infection   . Anxiety   . Ovarian cyst     Past Surgical History  Procedure Laterality Date  . Induced abortion      x2  . Dilation and evacuation  07/25/2011    Procedure: DILATATION AND EVACUATION;  Surgeon: Leslie Andrea, MD;  Location: [facility];  Service: Gynecology;  Laterality: N/A;  . Dilation and curettage of uterus      Family History  Problem Relation Age of Onset  . Anesthesia problems Neg Hx   . Other Neg Hx     Social History Social History  Substance Use Topics  . Smoking status: Never Smoker   . Smokeless tobacco: Never Used  . Alcohol Use: No    No Known Allergies  Current Outpatient Prescriptions  Medication Sig Dispense Refill  . hydrocortisone cream 1 % Apply to affected area 2 times daily 30 g 1  . neomycin-bacitracin-polymyxin (NEOSPORIN) [phone] ointment Apply topically 4 (four) times daily. 28.3 g 2   No current facility-administered medications for this visit.    Review of Systems Review of Systems Constitutional: negative for fatigue and weight loss Respiratory: negative for cough and wheezing Cardiovascular: negative for chest pain, fatigue and palpitations Gastrointestinal: negative for abdominal pain and change in bowel habits Genitourinary: + painful, pruritic leisions Integument/breast: negative for nipple discharge Musculoskeletal:negative for myalgias Neurological:  negative for gait problems and tremors Behavioral/Psych: negative for abusive relationship, depression Endocrine: negative for temperature intolerance     Blood pressure 105/67, pulse 81, temperature 98.6 F (37 C), weight 173 lb (78.472 kg), unknown if currently breastfeeding.  Physical Exam Physical Exam General:   alert  Skin:   no rash or abnormalities  Lungs:   clear to auscultation bilaterally  Heart:   regular rate and rhythm, S1, S2 normal, no murmur, click, rub or gallop  Breasts:   deferred  Abdomen:  normal findings: no organomegaly, soft, non-tender and no hernia  Pelvis:  External genitalia: normal general appearance, + pinpoint small papules on lower left buttocks, + multiple small papules slightly erythremic and tender diffuse on pubic hair, no pus present, no indurated center.   Urinary system: urethral meatus normal and bladder without fullness, nontender Vaginal: normal without tenderness, induration or masses Cervix: no CMT Adnexa: normal bimanual exam Uterus: anteverted and non-tender, normal size    50% of 15 min visit spent on counseling and coordination of care.   Data Reviewed Previous medical hx, labs, meds  Assessment     Small papules: most likely Folliculitis    Plan    Orders Placed This Encounter  Procedures  . SureSwab, Vaginosis/Vaginitis Plus  . Herpes simplex virus culture   Meds ordered this encounter  Medications  . neomycin-bacitracin-polymyxin (NEOSPORIN) [phone] ointment    Sig: Apply topically 4 (four) times daily.  Dispense:  28.3 g    Refill:  2  . hydrocortisone cream 1 %    Sig: Apply to affected area 2 times daily    Dispense:  30 g    Refill:  1    Possible management options include: acyclovir if no improvement/worsening of symptoms Follow up as needed or in one year for annual exam.

## 2015-01-08 ENCOUNTER — Encounter: Payer: Self-pay | Admitting: Certified Nurse Midwife

## 2015-01-08 ENCOUNTER — Ambulatory Visit (INDEPENDENT_AMBULATORY_CARE_PROVIDER_SITE_OTHER): Payer: Medicaid Other | Admitting: Certified Nurse Midwife

## 2015-01-08 VITALS — BP 104/65 | HR 72 | Temp 98.3°F | Wt 170.0 lb

## 2015-01-08 DIAGNOSIS — Z3049 Encounter for surveillance of other contraceptives: Secondary | ICD-10-CM | POA: Diagnosis not present

## 2015-01-08 DIAGNOSIS — Z30011 Encounter for initial prescription of contraceptive pills: Secondary | ICD-10-CM

## 2015-01-08 LAB — HERPES SIMPLEX VIRUS CULTURE: ORGANISM ID, BACTERIA: NOT DETECTED

## 2015-01-08 MED ORDER — LEVONORGEST-ETH ESTRAD 91-DAY 0.15-0.03 &0.01 MG PO TABS: 1.0000 | ORAL_TABLET | Freq: Every day | ORAL | Status: DC

## 2015-01-08 NOTE — Progress Notes (Signed)
Patient ID: Angela Riley, female   DOB: 03/14/1990, 24 y.o.   MRN: [id number]  Procedure Note Removal of Nexplanon  Patient had Nexplanon inserted in 2013. Desires removal today.   Reviewed risk and benefits of procedure. Alternative options discussed Patient reported understanding and agreed to continue.   The patient's left arm was palpated and the implant device located. The area was prepped with Betadinex3. The distal end of the device was palpated and 1 cc of 1% lidocaine with epinephrine was injected. A 2 mm incision was made. Any fibrotic tissue was carefully dissected away using blunt and/or sharp dissection. Over the tip and the tip was exposed, grasped with forcep and removed intact. Steri-strips and a sterile dressing were applied to the incision.   And a bandage applied and the arm was wrapped with gauze bandage.  The patient tolerated well.  Instructions:  The patient was instructed to remove the dressing in 24 hours and that some bruising is to be expected.  She was advised to use over the counter analgesics as needed for any pain at the site.  She is to keep the area dry for 24 hours and to call if her hand or arm becomes cold, numb, or blue.  Return visit:  Return in 11 months for annual exam, unless she has problems as discussed. Patient plans oral contraceptives (estrogen/progesterone), Seasonique, F/U instructions given.   Orvilla Cornwallachelle Skylen Danielsen CNM

## 2015-01-09 LAB — SURESWAB, VAGINOSIS/VAGINITIS PLUS
Atopobium vaginae: NOT DETECTED Log (cells/mL)
C. ALBICANS, DNA: NOT DETECTED
C. PARAPSILOSIS, DNA: NOT DETECTED
C. TRACHOMATIS RNA, TMA: NOT DETECTED
C. TROPICALIS, DNA: NOT DETECTED
C. glabrata, DNA: NOT DETECTED
Gardnerella vaginalis: <4.7 Log (cells/mL)
LACTOBACILLUS SPECIES: 7.1 Log (cells/mL)
MEGASPHAERA SPECIES: NOT DETECTED Log (cells/mL)
N. GONORRHOEAE RNA, TMA: NOT DETECTED
T. VAGINALIS RNA, QL TMA: NOT DETECTED

## 2015-02-05 ENCOUNTER — Ambulatory Visit: Payer: Medicaid Other | Admitting: Certified Nurse Midwife

## 2015-03-27 ENCOUNTER — Encounter (HOSPITAL_COMMUNITY): Payer: Self-pay | Admitting: *Deleted

## 2015-03-27 ENCOUNTER — Emergency Department (HOSPITAL_COMMUNITY): Payer: Medicaid Other

## 2015-03-27 DIAGNOSIS — J45909 Unspecified asthma, uncomplicated: Secondary | ICD-10-CM | POA: Insufficient documentation

## 2015-03-27 DIAGNOSIS — R079 Chest pain, unspecified: Secondary | ICD-10-CM | POA: Diagnosis not present

## 2015-03-27 DIAGNOSIS — R5383 Other fatigue: Secondary | ICD-10-CM | POA: Diagnosis not present

## 2015-03-27 DIAGNOSIS — R51 Headache: Secondary | ICD-10-CM | POA: Diagnosis present

## 2015-03-27 LAB — CBC
HEMATOCRIT: 39.3 % (ref 36.0–46.0)
HEMOGLOBIN: 13.1 g/dL (ref 12.0–15.0)
MCH: 29 pg (ref 26.0–34.0)
MCHC: 33.3 g/dL (ref 30.0–36.0)
MCV: 86.9 fL (ref 78.0–100.0)
Platelets: 240 10*3/uL (ref 150–400)
RBC: 4.52 MIL/uL (ref 3.87–5.11)
RDW: 13.2 % (ref 11.5–15.5)
WBC: 7.6 10*3/uL (ref 4.0–10.5)

## 2015-03-27 LAB — BASIC METABOLIC PANEL
Anion gap: 10 (ref 5–15)
BUN: 10 mg/dL (ref 6–20)
CO2: 22 mmol/L (ref 22–32)
Calcium: 9.4 mg/dL (ref 8.9–10.3)
Chloride: 106 mmol/L (ref 101–111)
Creatinine, Ser: 0.77 mg/dL (ref 0.44–1.00)
GFR calc Af Amer: >60 mL/min (ref >60)
GLUCOSE: 102 mg/dL — AB (ref 65–99)
POTASSIUM: 3.6 mmol/L (ref 3.5–5.1)
Sodium: 138 mmol/L (ref 135–145)

## 2015-03-27 LAB — I-STAT TROPONIN, ED: Troponin i, poc: 0 ng/mL (ref 0.00–0.08)

## 2015-03-27 LAB — POC URINE PREG, ED: Preg Test, Ur: NEGATIVE

## 2015-03-27 NOTE — ED Notes (Signed)
Patient states she has been having central CP for years but it has been getting worse esp when she lays down (hx of GERD without meds)  Also headaches (on the both sides of her head)

## 2015-03-28 ENCOUNTER — Emergency Department (HOSPITAL_COMMUNITY)
Admission: EM | Admit: 2015-03-28 | Discharge: 2015-03-28 | Disposition: A | Discharge status: Home / Self Care | Payer: Medicaid Other | Attending: Emergency Medicine | Admitting: Emergency Medicine

## 2015-03-28 NOTE — ED Notes (Signed)
Pt left AMA °

## 2015-07-21 ENCOUNTER — Ambulatory Visit (INDEPENDENT_AMBULATORY_CARE_PROVIDER_SITE_OTHER): Payer: Medicaid Other | Admitting: Obstetrics

## 2015-07-21 VITALS — BP 105/65 | HR 72 | Wt 176.0 lb

## 2015-07-21 DIAGNOSIS — R350 Frequency of micturition: Secondary | ICD-10-CM

## 2015-07-21 DIAGNOSIS — N946 Dysmenorrhea, unspecified: Secondary | ICD-10-CM

## 2015-07-21 DIAGNOSIS — Z30011 Encounter for initial prescription of contraceptive pills: Secondary | ICD-10-CM

## 2015-07-21 DIAGNOSIS — Z0000 Encounter for general adult medical examination without abnormal findings: Secondary | ICD-10-CM

## 2015-07-21 LAB — POCT URINALYSIS DIPSTICK
BILIRUBIN UA: NEGATIVE
GLUCOSE UA: NEGATIVE
Ketones, UA: NEGATIVE
Protein, UA: NEGATIVE
RBC UA: NEGATIVE
Spec Grav, UA: 1.025
UROBILINOGEN UA: NEGATIVE
pH, UA: 5

## 2015-07-21 MED ORDER — VITAFOL FE+ 90-1-200 & 50 MG PO CPPK: 2.0000 | ORAL_CAPSULE | Freq: Every day | ORAL | Status: DC

## 2015-07-21 MED ORDER — IBUPROFEN 800 MG PO TABS: 800.0000 mg | ORAL_TABLET | Freq: Three times a day (TID) | ORAL | Status: DC | PRN

## 2015-07-21 MED ORDER — NORGESTIMATE-ETH ESTRADIOL 0.25-35 MG-MCG PO TABS: 1.0000 | ORAL_TABLET | Freq: Every day | ORAL | Status: DC

## 2015-07-22 ENCOUNTER — Encounter: Payer: Self-pay | Admitting: Obstetrics

## 2015-07-22 NOTE — Addendum Note (Signed)
Addended by: Marya LandryFOSTER, Armanii Pressnell D on: 07/22/2015 05:53 PM   Modules accepted: Orders

## 2015-07-22 NOTE — Progress Notes (Signed)
Patient ID: Angela Riley, female   DOB: 07-16-1990, 25 y.o.   MRN: [id number]  Chief Complaint  Patient presents with  . Dysmenorrhea    cramping lower pelvic, frequent urination    HPI Angela Riley is a 25 y.o. female.  Cramping and heavy bleeding with periods  HPI  Past Medical History  Diagnosis Date  . Asthma   . GERD (gastroesophageal reflux disease)   . Chlamydia 01/2010  . BV (bacterial vaginosis)   . Urinary tract infection   . Anxiety   . Ovarian cyst     Past Surgical History  Procedure Laterality Date  . Induced abortion      x2  . Dilation and evacuation  07/25/2011    Procedure: DILATATION AND EVACUATION;  Surgeon: Leslie AndreaJames E Tomblin II, MD;  Location: [facility];  Service: Gynecology;  Laterality: N/A;  . Dilation and curettage of uterus      Family History  Problem Relation Age of Onset  . Anesthesia problems Neg Hx   . Other Neg Hx     Social History Social History  Substance Use Topics  . Smoking status: Never Smoker   . Smokeless tobacco: Never Used  . Alcohol Use: No    No Known Allergies  Current Outpatient Prescriptions  Medication Sig Dispense Refill  . hydrocortisone cream 1 % Apply to affected area 2 times daily (Patient not taking: Reported on 07/21/2015) 30 g 1  . ibuprofen (ADVIL,MOTRIN) 800 MG tablet Take 1 tablet (800 mg total) by mouth every 8 (eight) hours as needed. 30 tablet 5  . Levonorgestrel-Ethinyl Estradiol (SEASONIQUE) 0.15-0.03 &0.01 MG tablet Take 1 tablet by mouth daily. (Patient not taking: Reported on 07/21/2015) 1 Package 4  . neomycin-bacitracin-polymyxin (NEOSPORIN) [phone] ointment Apply topically 4 (four) times daily. (Patient not taking: Reported on 07/21/2015) 28.3 g 2  . norgestimate-ethinyl estradiol (ORTHO-CYCLEN,SPRINTEC,PREVIFEM) 0.25-35 MG-MCG tablet Take 1 tablet by mouth daily. 1 Package 11  . Prenat-FePoly-Metf-FA-DHA-DSS (VITAFOL FE+) 90-1-200 & 50 MG CPPK Take 2 tablets by mouth daily before breakfast.  60 each 11   No current facility-administered medications for this visit.    Review of Systems Review of Systems Constitutional: negative for fatigue and weight loss Respiratory: negative for cough and wheezing Cardiovascular: negative for chest pain, fatigue and palpitations Gastrointestinal: negative for abdominal pain and change in bowel habits Genitourinary:posit`ive for heavy bleeding and cramping with periods Integument/breast: negative for nipple discharge Musculoskeletal:negative for myalgias Neurological: negative for gait problems and tremors Behavioral/Psych: negative for abusive relationship, depression Endocrine: negative for temperature intolerance     Blood pressure 105/65, pulse 72, weight 176 lb (79.833 kg), last menstrual period 07/15/2015, unknown if currently breastfeeding.  Physical Exam Physical Exam General:   alert  Skin:   no rash or abnormalities  Lungs:   clear to auscultation bilaterally  Heart:   regular rate and rhythm, S1, S2 normal, no murmur, click, rub or gallop  Breasts:   normal without suspicious masses, skin or nipple changes or axillary nodes  Abdomen:  normal findings: no organomegaly, soft, non-tender and no hernia  Pelvis:  External genitalia: normal general appearance Urinary system: urethral meatus normal and bladder without fullness, nontender Vaginal: normal without tenderness, induration or masses Cervix: normal appearance Adnexa: normal bimanual exam Uterus: anteverted and non-tender, normal size      Data Reviewed Labs  Assessment     Dysmenorrhea  Contraceptive counseling and advice`    Plan    Ibuprofen Rx Ortho Cyclen Rx F/U 3  months  Orders Placed This Encounter  Procedures  . Culture, OB Urine   Meds ordered this encounter  Medications  . ibuprofen (ADVIL,MOTRIN) 800 MG tablet    Sig: Take 1 tablet (800 mg total) by mouth every 8 (eight) hours as needed.    Dispense:  30 tablet    Refill:  5  .  norgestimate-ethinyl estradiol (ORTHO-CYCLEN,SPRINTEC,PREVIFEM) 0.25-35 MG-MCG tablet    Sig: Take 1 tablet by mouth daily.    Dispense:  1 Package    Refill:  11  . Prenat-FePoly-Metf-FA-DHA-DSS (VITAFOL FE+) 90-1-200 & 50 MG CPPK    Sig: Take 2 tablets by mouth daily before breakfast.    Dispense:  60 each    Refill:  11

## 2015-07-23 LAB — CULTURE, OB URINE

## 2015-07-23 LAB — URINE CULTURE, OB REFLEX

## 2015-07-26 ENCOUNTER — Other Ambulatory Visit: Payer: Self-pay | Admitting: Obstetrics

## 2015-07-26 LAB — NUSWAB VG+, CANDIDA 6SP
CANDIDA ALBICANS, NAA: POSITIVE — AB
CANDIDA GLABRATA, NAA: NEGATIVE
CANDIDA KRUSEI, NAA: NEGATIVE
CANDIDA LUSITANIAE, NAA: NEGATIVE
CANDIDA PARAPSILOSIS, NAA: NEGATIVE
CANDIDA TROPICALIS, NAA: NEGATIVE
Chlamydia trachomatis, NAA: NEGATIVE
NEISSERIA GONORRHOEAE, NAA: NEGATIVE
Trich vag by NAA: NEGATIVE

## 2015-07-27 ENCOUNTER — Other Ambulatory Visit: Payer: Self-pay | Admitting: Obstetrics

## 2015-07-27 DIAGNOSIS — B373 Candidiasis of vulva and vagina: Secondary | ICD-10-CM

## 2015-07-27 DIAGNOSIS — B3731 Acute candidiasis of vulva and vagina: Secondary | ICD-10-CM

## 2015-07-27 MED ORDER — FLUCONAZOLE 150 MG PO TABS
150.0000 mg | ORAL_TABLET | Freq: Once | ORAL | Status: DC
Start: 2015-07-27 — End: 2015-11-01

## 2015-07-28 ENCOUNTER — Encounter: Payer: Self-pay | Admitting: *Deleted

## 2015-08-24 ENCOUNTER — Other Ambulatory Visit (INDEPENDENT_AMBULATORY_CARE_PROVIDER_SITE_OTHER): Payer: Medicaid Other

## 2015-08-24 VITALS — BP 107/70 | HR 71 | Temp 99.3°F | Wt 174.0 lb

## 2015-08-24 DIAGNOSIS — R350 Frequency of micturition: Secondary | ICD-10-CM | POA: Diagnosis not present

## 2015-08-24 DIAGNOSIS — Z309 Encounter for contraceptive management, unspecified: Secondary | ICD-10-CM

## 2015-08-24 LAB — POCT URINALYSIS DIPSTICK
BILIRUBIN UA: NEGATIVE
GLUCOSE UA: NEGATIVE
Ketones, UA: NEGATIVE
LEUKOCYTES UA: NEGATIVE
NITRITE UA: NEGATIVE
PH UA: 7
Protein, UA: NEGATIVE
RBC UA: 50
Spec Grav, UA: <=1.005
UROBILINOGEN UA: NEGATIVE

## 2015-08-24 MED ORDER — NITROFURANTOIN MONOHYD MACRO 100 MG PO CAPS: 100.0000 mg | ORAL_CAPSULE | Freq: Two times a day (BID) | ORAL | Status: DC

## 2015-08-24 NOTE — Addendum Note (Signed)
Addended by: Elby BeckPAUL, Mariaclara Spear F on: 08/24/2015 03:15 PM   Modules accepted: Orders

## 2015-08-24 NOTE — Progress Notes (Signed)
Patient is having urinary symptoms. She is complaining of frequency and a sensation when her stream is finished. Per protocol- macrobid sent to lab and culture ordered.

## 2015-08-26 ENCOUNTER — Other Ambulatory Visit: Payer: Self-pay | Admitting: Obstetrics

## 2015-08-26 LAB — URINE CULTURE

## 2015-08-30 ENCOUNTER — Ambulatory Visit: Payer: Self-pay | Admitting: Obstetrics and Gynecology

## 2015-09-22 ENCOUNTER — Encounter (HOSPITAL_COMMUNITY): Payer: Self-pay | Admitting: *Deleted

## 2015-09-22 ENCOUNTER — Emergency Department (HOSPITAL_COMMUNITY)
Admission: EM | Admit: 2015-09-22 | Discharge: 2015-09-22 | Disposition: A | Discharge status: Home / Self Care | Payer: Medicaid Other | Attending: Emergency Medicine | Admitting: Emergency Medicine

## 2015-09-22 DIAGNOSIS — J45909 Unspecified asthma, uncomplicated: Secondary | ICD-10-CM | POA: Diagnosis not present

## 2015-09-22 DIAGNOSIS — Z136 Encounter for screening for cardiovascular disorders: Secondary | ICD-10-CM

## 2015-09-22 DIAGNOSIS — F909 Attention-deficit hyperactivity disorder, unspecified type: Secondary | ICD-10-CM | POA: Diagnosis not present

## 2015-09-22 NOTE — Discharge Instructions (Signed)
Your EKG is normal. I attached a copy for your records. Please follow up with your primary care provider this week so you can start your Concerta.

## 2015-09-22 NOTE — ED Provider Notes (Signed)
[facility]ENCY [facility] Provider Note   CSN: [id number] Arrival date & time: 09/22/15  1247  By signing my name below, I, Javier Dockerobert Ryan Halas, attest that this documentation has been prepared under the direction and in the presence of Noelle PennerSerena Markeia Harkless, [place]PA-C. Electronically Signed: Javier Dockerobert Ryan Halas, ER Scribe. 09/18/2015. 1:37 PM.   First MD Initiated Contact with Patient 09/22/15 1335    History   Chief Complaint Chief Complaint  Patient presents with  . Letter for School/Work   The history is provided by the patient. No language interpreter was used.   HPI Comments: Angela Riley is a 25 y.o. female who presents to the Emergency Department needing an EKG because she is starting concerta and is required by her psychiatrist to get an EKG. Her primary care provider does not have EKG capabilities. She has no personal or family history of heart issues. She denies chest pain or SOB. Has no other complaints at this time.   Past Medical History:  Diagnosis Date  . Anxiety   . Asthma   . BV (bacterial vaginosis)   . Chlamydia 01/2010  . GERD (gastroesophageal reflux disease)   . Ovarian cyst   . Urinary tract infection     There are no active problems to display for this patient.   Past Surgical History:  Procedure Laterality Date  . DILATION AND CURETTAGE OF UTERUS    . DILATION AND EVACUATION  07/25/2011   Procedure: DILATATION AND EVACUATION;  Surgeon: Leslie AndreaJames E Tomblin II, MD;  Location: [facility];  Service: Gynecology;  Laterality: N/A;  . INDUCED ABORTION     x2    OB History    Gravida Para Term Preterm AB Living   4 0 0 0 3 0   SAB TAB Ectopic Multiple Live Births   1 2 0 0         Home Medications    Prior to Admission medications   Medication Sig Start Date End Date Taking? Authorizing Provider  fluconazole (DIFLUCAN) 150 MG tablet Take 1 tablet (150 mg total) by mouth once. 07/27/15   Brock Badharles A Harper, MD  hydrocortisone cream 1 % Apply to affected area 2 times  daily Patient not taking: Reported on 07/21/2015 01/06/15 01/06/16  Rachelle A Denney, CNM  ibuprofen (ADVIL,MOTRIN) 800 MG tablet Take 1 tablet (800 mg total) by mouth every 8 (eight) hours as needed. 07/21/15   Brock Badharles A Harper, MD  Levonorgestrel-Ethinyl Estradiol (SEASONIQUE) 0.15-0.03 &0.01 MG tablet Take 1 tablet by mouth daily. Patient not taking: Reported on 07/21/2015 01/08/15   Roe Coombsachelle A Denney, CNM  neomycin-bacitracin-polymyxin (NEOSPORIN) [phone] ointment Apply topically 4 (four) times daily. Patient not taking: Reported on 07/21/2015 01/06/15   Rachelle A Denney, CNM  nitrofurantoin, macrocrystal-monohydrate, (MACROBID) 100 MG capsule Take 1 capsule (100 mg total) by mouth 2 (two) times daily. 08/24/15   Brock Badharles A Harper, MD  norgestimate-ethinyl estradiol (ORTHO-CYCLEN,SPRINTEC,PREVIFEM) 0.25-35 MG-MCG tablet Take 1 tablet by mouth daily. 07/21/15   Brock Badharles A Harper, MD  Prenat-FePoly-Metf-FA-DHA-DSS (VITAFOL FE+) 90-1-200 & 50 MG CPPK Take 2 tablets by mouth daily before breakfast. 07/21/15   Brock Badharles A Harper, MD    Family History Family History  Problem Relation Age of Onset  . Anesthesia problems Neg Hx   . Other Neg Hx     Social History Social History  Substance Use Topics  . Smoking status: Never Smoker  . Smokeless tobacco: Never Used  . Alcohol use No     Allergies   Review of  patient's allergies indicates no known allergies.   Review of Systems Review of Systems At least 10pt or greater review of systems completed and are negative except where specified in the HPI.   Physical Exam Updated Vital Signs BP 112/83 (BP Location: Right Arm)   Pulse 72   Temp 98.8 F (37.1 C) (Oral)   Resp 12   Ht 5\' 5"  (1.651 m)   Wt 172 lb (78 kg)   LMP 08/08/2015 (Exact Date)   SpO2 100%   BMI 28.62 kg/m   Physical Exam  Constitutional: She is oriented to person, place, and time. She appears well-developed and well-nourished. No distress.  HENT:  Head:  Normocephalic and atraumatic.  Eyes: Pupils are equal, round, and reactive to light.  Neck: Neck supple.  Cardiovascular: Normal rate.   Pulmonary/Chest: Effort normal. No respiratory distress.  Musculoskeletal: Normal range of motion.  Neurological: She is alert and oriented to person, place, and time. Coordination normal.  Skin: Skin is warm and dry. She is not diaphoretic.  Psychiatric: She has a normal mood and affect. Her behavior is normal.  Nursing note and vitals reviewed.   ED Treatments / Results  DIAGNOSTIC STUDIES: Oxygen Saturation is 100% on RA, normal by my interpretation.    COORDINATION OF CARE: 1:37 PM Discussed treatment plan with pt at bedside and pt agreed to plan.   Labs (all labs ordered are listed, but only abnormal results are displayed) Labs Reviewed - No data to display  EKG  EKG Interpretation None       Radiology No results found.  Procedures Procedures (including critical care time)  Medications Ordered in ED Medications - No data to display   Initial Impression / Assessment and Plan / ED Course  I have reviewed the triage vital signs and the nursing notes.  Pertinent labs & imaging results that were available during my care of the patient were reviewed by me and considered in my medical decision making (see chart for details).  Clinical Course   EKG nonacute in NSR. Copy given to pt. She is otherwise nontoxic and has nonfocal exam. Instructed f/u with pcp and psych.  Final Clinical Impressions(s) / ED Diagnoses   Final diagnoses:  Attention deficit hyperactivity disorder (ADHD), unspecified ADHD type    New Prescriptions Discharge Medication List as of 09/22/2015  1:38 PM      I personally performed the services described in this documentation, which was scribed in my presence. The recorded information has been reviewed and is accurate.   Carlene CoriaSerena Y Laekyn Rayos, PA-C 09/22/15 1506    Donnetta HutchingBrian Cook, MD 09/26/15 [id number]

## 2015-09-22 NOTE — ED Triage Notes (Signed)
Pt reports being here for EKG only so she can be started on ADHD meds. Reports its required to have EKG done prior to starting and her pcp was unable to do it. No other complaints.

## 2015-09-22 NOTE — ED Notes (Signed)
Declined W/C at D/C and was escorted to lobby by RN. 

## 2015-11-01 ENCOUNTER — Inpatient Hospital Stay (HOSPITAL_COMMUNITY)
Admission: AD | Admit: 2015-11-01 | Discharge: 2015-11-01 | Disposition: A | Discharge status: Home / Self Care | Payer: Medicaid Other | Source: Ambulatory Visit | Attending: Family Medicine | Admitting: Family Medicine

## 2015-11-01 ENCOUNTER — Inpatient Hospital Stay (HOSPITAL_COMMUNITY): Payer: Medicaid Other

## 2015-11-01 ENCOUNTER — Encounter (HOSPITAL_COMMUNITY): Payer: Self-pay | Admitting: *Deleted

## 2015-11-01 DIAGNOSIS — R109 Unspecified abdominal pain: Secondary | ICD-10-CM | POA: Insufficient documentation

## 2015-11-01 DIAGNOSIS — Z3A01 Less than 8 weeks gestation of pregnancy: Secondary | ICD-10-CM | POA: Diagnosis not present

## 2015-11-01 DIAGNOSIS — O26891 Other specified pregnancy related conditions, first trimester: Secondary | ICD-10-CM | POA: Insufficient documentation

## 2015-11-01 DIAGNOSIS — O209 Hemorrhage in early pregnancy, unspecified: Secondary | ICD-10-CM

## 2015-11-01 DIAGNOSIS — O4691 Antepartum hemorrhage, unspecified, first trimester: Secondary | ICD-10-CM

## 2015-11-01 DIAGNOSIS — Z3491 Encounter for supervision of normal pregnancy, unspecified, first trimester: Secondary | ICD-10-CM

## 2015-11-01 LAB — URINALYSIS, ROUTINE W REFLEX MICROSCOPIC
BILIRUBIN URINE: NEGATIVE
Glucose, UA: NEGATIVE mg/dL
Hgb urine dipstick: NEGATIVE
Ketones, ur: NEGATIVE mg/dL
Leukocytes, UA: NEGATIVE
NITRITE: NEGATIVE
PH: 6 (ref 5.0–8.0)
Protein, ur: NEGATIVE mg/dL

## 2015-11-01 LAB — CBC
HEMATOCRIT: 38.2 % (ref 36.0–46.0)
Hemoglobin: 13.4 g/dL (ref 12.0–15.0)
MCH: 29.4 pg (ref 26.0–34.0)
MCHC: 35.1 g/dL (ref 30.0–36.0)
MCV: 83.8 fL (ref 78.0–100.0)
Platelets: 272 10*3/uL (ref 150–400)
RBC: 4.56 MIL/uL (ref 3.87–5.11)
RDW: 13.4 % (ref 11.5–15.5)
WBC: 9 10*3/uL (ref 4.0–10.5)

## 2015-11-01 LAB — WET PREP, GENITAL
CLUE CELLS WET PREP: NONE SEEN
Sperm: NONE SEEN
TRICH WET PREP: NONE SEEN
Yeast Wet Prep HPF POC: NONE SEEN

## 2015-11-01 LAB — POCT PREGNANCY, URINE: Preg Test, Ur: POSITIVE — AB

## 2015-11-01 LAB — HCG, QUANTITATIVE, PREGNANCY: hCG, Beta Chain, Quant, S: 237 m[IU]/mL — ABNORMAL HIGH (ref <5)

## 2015-11-01 NOTE — Discharge Instructions (Signed)
First Trimester of Pregnancy The first trimester of pregnancy is from week 1 until the end of week 12 (months 1 through 3). A week after a sperm fertilizes an egg, the egg will implant on the wall of the uterus. This embryo will begin to develop into a baby. Genes from you and your partner are forming the baby. The female genes determine whether the baby is a boy or a girl. At 6-8 weeks, the eyes and face are formed, and the heartbeat can be seen on ultrasound. At the end of 12 weeks, all the baby's organs are formed.  Now that you are pregnant, you will want to do everything you can to have a healthy baby. Two of the most important things are to get good prenatal care and to follow your health care provider's instructions. Prenatal care is all the medical care you receive before the baby's birth. This care will help prevent, find, and treat any problems during the pregnancy and childbirth. BODY CHANGES Your body goes through many changes during pregnancy. The changes vary from woman to woman.   You may gain or lose a couple of pounds at first.  You may feel sick to your stomach (nauseous) and throw up (vomit). If the vomiting is uncontrollable, call your health care provider.  You may tire easily.  You may develop headaches that can be relieved by medicines approved by your health care provider.  You may urinate more often. Painful urination may mean you have a bladder infection.  You may develop heartburn as a result of your pregnancy.  You may develop constipation because certain hormones are causing the muscles that push waste through your intestines to slow down.  You may develop hemorrhoids or swollen, bulging veins (varicose veins).  Your breasts may begin to grow larger and become tender. Your nipples may stick out more, and the tissue that surrounds them (areola) may become darker.  Your gums may bleed and may be sensitive to brushing and flossing.  Dark spots or blotches (chloasma,  mask of pregnancy) may develop on your face. This will likely fade after the baby is born.  Your menstrual periods will stop.  You may have a loss of appetite.  You may develop cravings for certain kinds of food.  You may have changes in your emotions from day to day, such as being excited to be pregnant or being concerned that something may go wrong with the pregnancy and baby.  You may have more vivid and strange dreams.  You may have changes in your hair. These can include thickening of your hair, rapid growth, and changes in texture. Some women also have hair loss during or after pregnancy, or hair that feels dry or thin. Your hair will most likely return to normal after your baby is born. WHAT TO EXPECT AT YOUR PRENATAL VISITS During a routine prenatal visit:  You will be weighed to make sure you and the baby are growing normally.  Your blood pressure will be taken.  Your abdomen will be measured to track your baby's growth.  The fetal heartbeat will be listened to starting around week 10 or 12 of your pregnancy.  Test results from any previous visits will be discussed. Your health care provider may ask you:  How you are feeling.  If you are feeling the baby move.  If you have had any abnormal symptoms, such as leaking fluid, bleeding, severe headaches, or abdominal cramping.  If you are using any tobacco products,   including cigarettes, chewing tobacco, and electronic cigarettes.  If you have any questions. Other tests that may be performed during your first trimester include:  Blood tests to find your blood type and to check for the presence of any previous infections. They will also be used to check for low iron levels (anemia) and Rh antibodies. Later in the pregnancy, blood tests for diabetes will be done along with other tests if problems develop.  Urine tests to check for infections, diabetes, or protein in the urine.  An ultrasound to confirm the proper growth  and development of the baby.  An amniocentesis to check for possible genetic problems.  Fetal screens for spina bifida and Down syndrome.  You may need other tests to make sure you and the baby are doing well.  HIV (human immunodeficiency virus) testing. Routine prenatal testing includes screening for HIV, unless you choose not to have this test. HOME CARE INSTRUCTIONS  Medicines  Follow your health care provider's instructions regarding medicine use. Specific medicines may be either safe or unsafe to take during pregnancy.  Take your prenatal vitamins as directed.  If you develop constipation, try taking a stool softener if your health care provider approves. Diet  Eat regular, well-balanced meals. Choose a variety of foods, such as meat or vegetable-based protein, fish, milk and low-fat dairy products, vegetables, fruits, and whole grain breads and cereals. Your health care provider will help you determine the amount of weight gain that is right for you.  Avoid raw meat and uncooked cheese. These carry germs that can cause birth defects in the baby.  Eating four or five small meals rather than three large meals a day may help relieve nausea and vomiting. If you start to feel nauseous, eating a few soda crackers can be helpful. Drinking liquids between meals instead of during meals also seems to help nausea and vomiting.  If you develop constipation, eat more high-fiber foods, such as fresh vegetables or fruit and whole grains. Drink enough fluids to keep your urine clear or pale yellow. Activity and Exercise  Exercise only as directed by your health care provider. Exercising will help you:  Control your weight.  Stay in shape.  Be prepared for labor and delivery.  Experiencing pain or cramping in the lower abdomen or low back is a good sign that you should stop exercising. Check with your health care provider before continuing normal exercises.  Try to avoid standing for long  periods of time. Move your legs often if you must stand in one place for a long time.  Avoid heavy lifting.  Wear low-heeled shoes, and practice good posture.  You may continue to have sex unless your health care provider directs you otherwise. Relief of Pain or Discomfort  Wear a good support bra for breast tenderness.   Take warm sitz baths to soothe any pain or discomfort caused by hemorrhoids. Use hemorrhoid cream if your health care provider approves.   Rest with your legs elevated if you have leg cramps or low back pain.  If you develop varicose veins in your legs, wear support hose. Elevate your feet for 15 minutes, 3-4 times a day. Limit salt in your diet. Prenatal Care  Schedule your prenatal visits by the twelfth week of pregnancy. They are usually scheduled monthly at first, then more often in the last 2 months before delivery.  Write down your questions. Take them to your prenatal visits.  Keep all your prenatal visits as directed by your   health care provider. Safety  Wear your seat belt at all times when driving.  Make a list of emergency phone numbers, including numbers for family, friends, the hospital, and police and fire departments. General Tips  Ask your health care provider for a referral to a local prenatal education class. Begin classes no later than at the beginning of month 6 of your pregnancy.  Ask for help if you have counseling or nutritional needs during pregnancy. Your health care provider can offer advice or refer you to specialists for help with various needs.  Do not use hot tubs, steam rooms, or saunas.  Do not douche or use tampons or scented sanitary pads.  Do not cross your legs for long periods of time.  Avoid cat litter boxes and soil used by cats. These carry germs that can cause birth defects in the baby and possibly loss of the fetus by miscarriage or stillbirth.  Avoid all smoking, herbs, alcohol, and medicines not prescribed by  your health care provider. Chemicals in these affect the formation and growth of the baby.  Do not use any tobacco products, including cigarettes, chewing tobacco, and electronic cigarettes. If you need help quitting, ask your health care provider. You may receive counseling support and other resources to help you quit.  Schedule a dentist appointment. At home, brush your teeth with a soft toothbrush and be gentle when you floss. SEEK MEDICAL CARE IF:   You have dizziness.  You have mild pelvic cramps, pelvic pressure, or nagging pain in the abdominal area.  You have persistent nausea, vomiting, or diarrhea.  You have a bad smelling vaginal discharge.  You have pain with urination.  You notice increased swelling in your face, hands, legs, or ankles. SEEK IMMEDIATE MEDICAL CARE IF:   You have a fever.  You are leaking fluid from your vagina.  You have spotting or bleeding from your vagina.  You have severe abdominal cramping or pain.  You have rapid weight gain or loss.  You vomit blood or material that looks like coffee grounds.  You are exposed to German measles and have never had them.  You are exposed to fifth disease or chickenpox.  You develop a severe headache.  You have shortness of breath.  You have any kind of trauma, such as from a fall or a car accident.   This information is not intended to replace advice given to you by your health care provider. Make sure you discuss any questions you have with your health care provider.   Document Released: 01/17/2001 Document Revised: 02/13/2014 Document Reviewed: 12/03/2012 Elsevier Interactive Patient Education 2016 [employer]  

## 2015-11-01 NOTE — MAU Note (Signed)
LMP 8/21. Had some spotting over the weekend and a lot of cramping. Still having intermittent cramping.

## 2015-11-01 NOTE — MAU Provider Note (Signed)
History     CSN: [id number]  Arrival date and time: 11/01/15 1816   First Provider Initiated Contact with Patient 11/01/15 1930      Chief Complaint  Patient presents with  . Vaginal Bleeding  . Abdominal Cramping   HPI  Angela Riley is a 25 y.o. [id number] at 5053w0d by LMP  who presents with abdominal cramping & vaginal bleeding. Reports pink vaginal spotting on Sunday; no bleeding since then. Reports lower abdominal cramping since yesterday. Pain comes and goes. Currently rates pain 4/10. Took ibuprofen with mild relief.  Denies vaginal discharge, n/v/d, constipation, fever, or dysuria.   OB History    Gravida Para Term Preterm AB Living   4 1 1  0 2 1   SAB TAB Ectopic Multiple Live Births   1 1 0 0 1      Past Medical History:  Diagnosis Date  . Anxiety   . Asthma   . BV (bacterial vaginosis)   . Chlamydia 01/2010  . GERD (gastroesophageal reflux disease)   . Ovarian cyst   . Urinary tract infection     Past Surgical History:  Procedure Laterality Date  . DILATION AND CURETTAGE OF UTERUS    . DILATION AND EVACUATION  07/25/2011   Procedure: DILATATION AND EVACUATION;  Surgeon: Leslie AndreaJames E Tomblin II, MD;  Location: [facility];  Service: Gynecology;  Laterality: N/A;  . INDUCED ABORTION     x2    Family History  Problem Relation Age of Onset  . Anesthesia problems Neg Hx   . Other Neg Hx     Social History  Substance Use Topics  . Smoking status: Never Smoker  . Smokeless tobacco: Never Used  . Alcohol use No    Allergies: No Known Allergies  Prescriptions Prior to Admission  Medication Sig Dispense Refill Last Dose  . methylphenidate 18 MG PO CR tablet Take 18 mg by mouth daily.   Past Week at Unknown time  . Prenat-FePoly-Metf-FA-DHA-DSS (VITAFOL FE+) 90-1-200 & 50 MG CPPK Take 2 tablets by mouth daily before breakfast. 60 each 11 10/31/2015 at Unknown time    Review of Systems  Constitutional: Negative for chills and fever.  Gastrointestinal: Positive  for abdominal pain. Negative for constipation, diarrhea, nausea and vomiting.  Genitourinary: Negative.        + spotting yesterday, no bleeding since No vaginal discharge   Physical Exam   Blood pressure 115/64, pulse 76, temperature 99.3 F (37.4 C), temperature source Oral, height 5\' 5"  (1.651 m), weight 170 lb (77.1 kg), last menstrual period 09/27/2015, unknown if currently breastfeeding.  Physical Exam  Nursing note and vitals reviewed. Constitutional: She is oriented to person, place, and time. She appears well-developed and well-nourished. No distress.  HENT:  Head: Normocephalic and atraumatic.  Eyes: Conjunctivae are normal. Right eye exhibits no discharge. Left eye exhibits no discharge. No scleral icterus.  Neck: Normal range of motion.  Cardiovascular: Normal rate.   Respiratory: Effort normal. No respiratory distress.  GI: Soft. She exhibits no distension. There is no tenderness. There is no rebound and no guarding.  Genitourinary: Uterus normal. Cervix exhibits no motion tenderness. Right adnexum displays no mass and no tenderness. Left adnexum displays no mass and no tenderness. No bleeding in the vagina. Vaginal discharge (small amount of thin white discharge) found.  Genitourinary Comments: Cervix closed  Neurological: She is alert and oriented to person, place, and time.  Skin: Skin is warm and dry. She is not diaphoretic.  Psychiatric: She  has a normal mood and affect. Her behavior is normal. Judgment and thought content normal.    MAU Course  Procedures Results for orders placed or performed during the hospital encounter of 11/01/15 (from the past 24 hour(s))  Urinalysis, Routine w reflex microscopic (not at [facility])     Status: Abnormal   Collection Time: 11/01/15  6:37 PM  Result Value Ref Range   Color, Urine YELLOW YELLOW   APPearance CLEAR CLEAR   Specific Gravity, Urine >1.030 (H) 1.005 - 1.030   pH 6.0 5.0 - 8.0   Glucose, UA NEGATIVE NEGATIVE mg/dL    Hgb urine dipstick NEGATIVE NEGATIVE   Bilirubin Urine NEGATIVE NEGATIVE   Ketones, ur NEGATIVE NEGATIVE mg/dL   Protein, ur NEGATIVE NEGATIVE mg/dL   Nitrite NEGATIVE NEGATIVE   Leukocytes, UA NEGATIVE NEGATIVE  Pregnancy, urine POC     Status: Abnormal   Collection Time: 11/01/15  7:08 PM  Result Value Ref Range   Preg Test, Ur POSITIVE (A) NEGATIVE  CBC     Status: None   Collection Time: 11/01/15  7:17 PM  Result Value Ref Range   WBC 9.0 4.0 - 10.5 K/uL   RBC 4.56 3.87 - 5.11 MIL/uL   Hemoglobin 13.4 12.0 - 15.0 g/dL   HCT 40.9 81.1 - 91.4 %   MCV 83.8 78.0 - 100.0 fL   MCH 29.4 26.0 - 34.0 pg   MCHC 35.1 30.0 - 36.0 g/dL   RDW 78.2 95.6 - 21.3 %   Platelets 272 150 - 400 K/uL  hCG, quantitative, pregnancy     Status: Abnormal   Collection Time: 11/01/15  7:17 PM  Result Value Ref Range   hCG, Beta Chain, Quant, S 237 (H) <5 mIU/mL  Wet prep, genital     Status: Abnormal   Collection Time: 11/01/15  7:30 PM  Result Value Ref Range   Yeast Wet Prep HPF POC NONE SEEN NONE SEEN   Trich, Wet Prep NONE SEEN NONE SEEN   Clue Cells Wet Prep HPF POC NONE SEEN NONE SEEN   WBC, Wet Prep HPF POC FEW (A) NONE SEEN   Sperm NONE SEEN     MDM +UPT UA, wet prep, GC/chlamydia, CBC, ABO/Rh, quant hCG, HIV, and [place] today to rule out ectopic pregnancy Ultrasound report pending Care turned over to [facility]     Judeth Horn, NP 11/01/2015 9:13 PM    Assessment and Plan   US shows intrauterine gestational sac 4.6 weeks [facility]

## 2015-11-02 LAB — HIV ANTIBODY (ROUTINE TESTING W REFLEX): HIV SCREEN 4TH GENERATION: NONREACTIVE

## 2015-11-02 LAB — GC/CHLAMYDIA PROBE AMP (~~LOC~~) NOT AT ARMC
CHLAMYDIA, DNA PROBE: NEGATIVE
NEISSERIA GONORRHEA: NEGATIVE

## 2015-11-04 ENCOUNTER — Encounter: Payer: Self-pay | Admitting: Obstetrics

## 2015-11-04 ENCOUNTER — Ambulatory Visit (INDEPENDENT_AMBULATORY_CARE_PROVIDER_SITE_OTHER): Payer: Medicaid Other | Admitting: Obstetrics

## 2015-11-04 VITALS — BP 113/70 | HR 84

## 2015-11-04 DIAGNOSIS — O3680X1 Pregnancy with inconclusive fetal viability, fetus 1: Secondary | ICD-10-CM

## 2015-11-04 NOTE — Progress Notes (Signed)
Patient ID: Angela Riley, female   DOB: April 23, 1990, 25 y.o.   MRN: [id number]  Chief Complaint  Patient presents with  . Follow-up    hospital follow up    HPI Angela Riley is a 25 y.o. female.  [facility].  U/S showed a ? Intrauterine gestational sac.  Quant. Beta hCG was ~ 257.  Presents today for follow up.  Denies cramping or vaginal bleeding. HPI  Past Medical History:  Diagnosis Date  . Anxiety   . Asthma   . Chlamydia 01/2010  . GERD (gastroesophageal reflux disease)   . Ovarian cyst     Past Surgical History:  Procedure Laterality Date  . DILATION AND CURETTAGE OF UTERUS    . DILATION AND EVACUATION  07/25/2011   Procedure: DILATATION AND EVACUATION;  Surgeon: Leslie AndreaJames E Tomblin II, MD;  Location: [facility];  Service: Gynecology;  Laterality: N/A;  . INDUCED ABORTION     x2    Family History  Problem Relation Age of Onset  . Anesthesia problems Neg Hx   . Other Neg Hx     Social History Social History  Substance Use Topics  . Smoking status: Never Smoker  . Smokeless tobacco: Never Used  . Alcohol use No    No Known Allergies  Current Outpatient Prescriptions  Medication Sig Dispense Refill  . Prenat-FePoly-Metf-FA-DHA-DSS (VITAFOL FE+) 90-1-200 & 50 MG CPPK Take 2 tablets by mouth daily before breakfast. 60 each 11   No current facility-administered medications for this visit.     Review of Systems Review of Systems Constitutional: negative for fatigue and weight loss Respiratory: negative for cough and wheezing Cardiovascular: negative for chest pain, fatigue and palpitations Gastrointestinal: negative for abdominal pain and change in bowel habits Genitourinary:positive for vaginal spotting and cramping Integument/breast: negative for nipple discharge Musculoskeletal:negative for myalgias Neurological: negative for gait problems and tremors Behavioral/Psych: negative for abusive relationship,  depression Endocrine: negative for temperature intolerance     Blood pressure 113/70, pulse 84, last menstrual period 09/27/2015, unknown if currently breastfeeding.  Physical Exam Physical Exam:  Deferred  >50% of 15 min visit spent on counseling and coordination of care.   Data Reviewed Ultrasound Quantitative beta hCG  Assessment     Early 1st trimester pregnancy    Plan    Quant. Repeated today F/U in 1 week for repeat Quant and possible ultrasound Ectopic precautions given  Orders Placed This Encounter  Procedures  . Beta HCG, Quant  . Beta HCG, Quant    Standing Status:   Future    Standing Expiration Date:   11/03/2016   No orders of the defined types were placed in this encounter.

## 2015-11-05 ENCOUNTER — Other Ambulatory Visit: Payer: Self-pay | Admitting: Obstetrics

## 2015-11-05 LAB — BETA HCG QUANT (REF LAB): hCG Quant: 317 m[IU]/mL

## 2015-11-09 ENCOUNTER — Ambulatory Visit: Payer: Medicaid Other | Admitting: Obstetrics

## 2015-11-10 ENCOUNTER — Other Ambulatory Visit: Payer: Medicaid Other

## 2015-11-10 DIAGNOSIS — O3680X1 Pregnancy with inconclusive fetal viability, fetus 1: Secondary | ICD-10-CM

## 2015-11-11 ENCOUNTER — Other Ambulatory Visit: Payer: Self-pay

## 2015-11-11 ENCOUNTER — Ambulatory Visit (INDEPENDENT_AMBULATORY_CARE_PROVIDER_SITE_OTHER): Payer: Medicaid Other

## 2015-11-11 ENCOUNTER — Ambulatory Visit (INDEPENDENT_AMBULATORY_CARE_PROVIDER_SITE_OTHER): Payer: Medicaid Other | Admitting: Obstetrics & Gynecology

## 2015-11-11 DIAGNOSIS — O209 Hemorrhage in early pregnancy, unspecified: Secondary | ICD-10-CM

## 2015-11-11 LAB — BETA HCG QUANT (REF LAB): hCG Quant: 669 m[IU]/mL

## 2015-11-11 NOTE — Progress Notes (Signed)
Ultrasounds Results Note  SUBJECTIVE HPI:  Angela Riley is a 25 y.o. [phone] at 6717w3d by LMP who [facility] followup ultrasound results. The patient denies abdominal pain or vaginal bleeding.  Upon review of the patient's records, patient was first seen in MAU on 9/25 for vaginal bleeding.   BHCG on that day was 237.  Ultrasound showed small possible GS. 9/28. BHCG was 317, then 669 on 10/4. No bleeding.  Repeat ultrasound was performed earlier today, still no fetal pole or YS   Past Medical History:  Diagnosis Date  . Anxiety   . Asthma   . Chlamydia 01/2010  . GERD (gastroesophageal reflux disease)   . Ovarian cyst    Past Surgical History:  Procedure Laterality Date  . DILATION AND CURETTAGE OF UTERUS    . DILATION AND EVACUATION  07/25/2011   Procedure: DILATATION AND EVACUATION;  Surgeon: Leslie AndreaJames E Tomblin II, MD;  Location: [facility];  Service: Gynecology;  Laterality: N/A;  . INDUCED ABORTION     x2   Social History   Social History  . Marital status: Single    Spouse name: N/A  . Number of children: N/A  . Years of education: N/A   Occupational History  . Not on file.   Social History Main Topics  . Smoking status: Never Smoker  . Smokeless tobacco: Never Used  . Alcohol use No  . Drug use: No  . Sexual activity: Yes    Partners: Male    Birth control/ protection: None     Comment: 1 partner   Other Topics Concern  . Not on file   Social History Narrative  . No narrative on file   Current Outpatient Prescriptions on File Prior to Visit  Medication Sig Dispense Refill  . Prenat-FePoly-Metf-FA-DHA-DSS (VITAFOL FE+) 90-1-200 & 50 MG CPPK Take 2 tablets by mouth daily before breakfast. 60 each 11   No current facility-administered medications on file prior to visit.    No Known Allergies  I have reviewed patient's Past Medical Hx, Surgical Hx, Family Hx, Social Hx, medications and allergies.   Review of Systems Review  of Systems  Constitutional: Negative for fever and chills.  Gastrointestinal: Negative for nausea, vomiting, abdominal pain, diarrhea and constipation.  Genitourinary: Negative for dysuria.  Musculoskeletal: Negative for back pain.  Neurological: Negative for dizziness and weakness.    Physical Exam  BP 100/68   Pulse 77   Temp 98.2 F (36.8 C) (Oral)   Wt 171 lb 9.6 oz (77.8 kg)   LMP 09/27/2015   BMI 28.56 kg/m   GENERAL: Well-developed, well-nourished female in no acute distress.  HEENT: Normocephalic, atraumatic.   LUNGS: Effort normal ABDOMEN: soft, non-tender HEART: Regular rate  SKIN: Warm, dry and without erythema PSYCH: Normal mood and affect NEURO: Alert and oriented x 4  LAB RESULTS No results found for this or any previous visit (from the past 24 hour(s)).  IMAGING [place] Ob Comp Less 14 Wks  Result Date: 11/01/2015 CLINICAL DATA:  25 year old female with positive HCG levels presenting with spotting and cramping. EXAM: OBSTETRIC <14 WK US AND TRANSVAGINAL OB US TECHNIQUE: Both transabdominal and transvaginal ultrasound examinations were performed for complete evaluation of the gestation as well as the maternal uterus, adnexal regions, and pelvic cul-de-sac. Transvaginal technique was performed to assess early pregnancy. COMPARISON:  None for this pregnancy. FINDINGS: A small cystic structure noted within the endometrial canal with possible mild decidual reaction. No fetal  pole or yolk sac identified within this structure. This may represent an early gestational sac, or a blighted ovum. A pseudo gestation of an ectopic pregnancy is not entirely excluded. Correlation with clinical exam and follow-up with serial HCG levels and ultrasound recommended. If this cystic structure is a true gestational sac the estimated gestational age based on mean sac diameter of 2 mm is 4 weeks, 6 days. Subchorionic hemorrhage:  None visualized. Maternal uterus/adnexae: The right ovary measures 2.7  x 1.8 x 3.0 cm and the left ovary measures 1.8 x 1.7 x 1.6 cm. The ovaries appear unremarkable. Small amount of free fluid noted within the pelvis. IMPRESSION: Tiny cystic structure within the endometrial canal may represent an early gestational sac. Correlation with clinical exam and follow-up with serial HCG levels and ultrasound recommended. Electronically Signed   By: Elgie Collard M.D.   On: 11/01/2015 21:25   US Ob Transvaginal  Result Date: 11/01/2015 CLINICAL DATA:  25 year old female with positive HCG levels presenting with spotting and cramping. EXAM: OBSTETRIC <14 WK [place] AND TRANSVAGINAL OB US TECHNIQUE: Both transabdominal and transvaginal ultrasound examinations were performed for complete evaluation of the gestation as well as the maternal uterus, adnexal regions, and pelvic cul-de-sac. Transvaginal technique was performed to assess early pregnancy. COMPARISON:  None for this pregnancy. FINDINGS: A small cystic structure noted within the endometrial canal with possible mild decidual reaction. No fetal pole or yolk sac identified within this structure. This may represent an early gestational sac, or a blighted ovum. A pseudo gestation of an ectopic pregnancy is not entirely excluded. Correlation with clinical exam and follow-up with serial HCG levels and ultrasound recommended. If this cystic structure is a true gestational sac the estimated gestational age based on mean sac diameter of 2 mm is 4 weeks, 6 days. Subchorionic hemorrhage:  None visualized. Maternal uterus/adnexae: The right ovary measures 2.7 x 1.8 x 3.0 cm and the left ovary measures 1.8 x 1.7 x 1.6 cm. The ovaries appear unremarkable. Small amount of free fluid noted within the pelvis. IMPRESSION: Tiny cystic structure within the endometrial canal may represent an early gestational sac. Correlation with clinical exam and follow-up with serial HCG levels and ultrasound recommended. Electronically Signed   By: Elgie Collard M.D.    On: 11/01/2015 21:25    ASSESSMENT 1. Antepartum bleeding, first trimester     PLAN Discharge home in stable condition Discussed likely blighted ovum Elects expectant management, will RTC next week    Adam Phenix, MD  11/11/2015  4:36 PM

## 2015-11-11 NOTE — Patient Instructions (Signed)

## 2015-11-12 ENCOUNTER — Telehealth: Payer: Self-pay | Admitting: *Deleted

## 2015-11-12 NOTE — Telephone Encounter (Signed)
Patient called to discuss her visit yesterday- she is not ready to accept that the pregnancy is over just yet. She wants to wait until her next visit to decide to take the medication. She does understand that her numbers are not rising the way they should and that the pregnancy is not developing the way it should. She was counciled on miscarriage precautions and she is aware of what to do and what to expect.

## 2015-11-15 ENCOUNTER — Other Ambulatory Visit: Payer: Medicaid Other

## 2015-11-15 DIAGNOSIS — O3680X0 Pregnancy with inconclusive fetal viability, not applicable or unspecified: Secondary | ICD-10-CM

## 2015-11-16 ENCOUNTER — Encounter: Payer: Self-pay | Admitting: *Deleted

## 2015-11-16 ENCOUNTER — Ambulatory Visit (INDEPENDENT_AMBULATORY_CARE_PROVIDER_SITE_OTHER): Payer: Medicaid Other | Admitting: Obstetrics & Gynecology

## 2015-11-16 ENCOUNTER — Encounter: Payer: Self-pay | Admitting: Obstetrics & Gynecology

## 2015-11-16 VITALS — BP 108/66 | HR 90 | Temp 98.7°F | Wt 173.8 lb

## 2015-11-16 DIAGNOSIS — O0281 Inappropriate change in quantitative human chorionic gonadotropin (hCG) in early pregnancy: Secondary | ICD-10-CM | POA: Diagnosis not present

## 2015-11-16 DIAGNOSIS — O209 Hemorrhage in early pregnancy, unspecified: Secondary | ICD-10-CM

## 2015-11-16 LAB — BETA HCG QUANT (REF LAB): hCG Quant: 1193 m[IU]/mL

## 2015-11-16 NOTE — Patient Instructions (Addendum)

## 2015-11-16 NOTE — Progress Notes (Signed)
Patient is in office to follow up on lab results.

## 2015-11-16 NOTE — Progress Notes (Signed)
Ultrasounds Results Note  SUBJECTIVE HPI:  Angela Riley is a 25 y.o. [phone] at 6160w1d by LMP who presents [facility] the [facility] for followup ultrasound results. The patient denies abdominal pain or vaginal bleeding.  Upon review of the patient's records, patient was first seen in MAU on 0925 for bleeding.   BHCG on that day was 237  Ultrasound showed possible GS.  Last seen in MAU on 928. BHCG was 317.  Repeat ultrasound 10/4 HCG  669, 1193 10/9  Past Medical History:  Diagnosis Date  . Anxiety   . Asthma   . Chlamydia 01/2010  . GERD (gastroesophageal reflux disease)   . Ovarian cyst    Past Surgical History:  Procedure Laterality Date  . DILATION AND CURETTAGE OF UTERUS    . DILATION AND EVACUATION  07/25/2011   Procedure: DILATATION AND EVACUATION;  Surgeon: Leslie AndreaJames E Tomblin II, MD;  Location: [facility];  Service: Gynecology;  Laterality: N/A;  . INDUCED ABORTION     x2   Social History   Social History  . Marital status: Single    Spouse name: N/A  . Number of children: N/A  . Years of education: N/A   Occupational History  . Not on file.   Social History Main Topics  . Smoking status: Never Smoker  . Smokeless tobacco: Never Used  . Alcohol use No  . Drug use: No  . Sexual activity: Yes    Partners: Male    Birth control/ protection: None     Comment: 1 partner   Other Topics Concern  . Not on file   Social History Narrative  . No narrative on file   Current Outpatient Prescriptions on File Prior to Visit  Medication Sig Dispense Refill  . Prenat-FePoly-Metf-FA-DHA-DSS (VITAFOL FE+) 90-1-200 & 50 MG CPPK Take 2 tablets by mouth daily before breakfast. 60 each 11   No current facility-administered medications on file prior to visit.    No Known Allergies  I have reviewed patient's Past Medical Hx, Surgical Hx, Family Hx, Social Hx, medications and allergies.   Review of Systems  Gastrointestinal: Positive for nausea.  Genitourinary:  Positive for pelvic pain (mild cramps). Negative for vaginal bleeding.   Review of Systems  Constitutional: Negative for fever and chills.  Gastrointestinal: Negative for nausea, vomiting, abdominal pain, diarrhea and constipation.  Genitourinary: Negative for dysuria.  Musculoskeletal: Negative for back pain.  Neurological: Negative for dizziness and weakness.    Physical Exam  BP 108/66   Pulse 90   Temp 98.7 F (37.1 C) (Oral)   Wt 173 lb 12.8 oz (78.8 kg)   LMP 09/27/2015   BMI 28.92 kg/m   GENERAL: Well-developed, well-nourished female in no acute distress.  HEENT: Normocephalic, atraumatic.   LUNGS: Effort normal ABDOMEN: soft, non-tender HEART: Regular rate  SKIN: Warm, dry and without erythema PSYCH: Normal mood and affect NEURO: Alert and oriented x 4  LAB RESULTS No results found for this or any previous visit (from the past 24 hour(s)).  IMAGING [place] Ob Comp Less 14 Wks  Result Date: 11/01/2015 CLINICAL DATA:  25 year old female with positive HCG levels presenting with spotting and cramping. EXAM: OBSTETRIC <14 WK US AND TRANSVAGINAL OB US TECHNIQUE: Both transabdominal and transvaginal ultrasound examinations were performed for complete evaluation of the gestation as well as the maternal uterus, adnexal regions, and pelvic cul-de-sac. Transvaginal technique was performed to assess early pregnancy. COMPARISON:  None for this pregnancy. FINDINGS: A small cystic structure  noted within the endometrial canal with possible mild decidual reaction. No fetal pole or yolk sac identified within this structure. This may represent an early gestational sac, or a blighted ovum. A pseudo gestation of an ectopic pregnancy is not entirely excluded. Correlation with clinical exam and follow-up with serial HCG levels and ultrasound recommended. If this cystic structure is a true gestational sac the estimated gestational age based on mean sac diameter of 2 mm is 4 weeks, 6 days. Subchorionic  hemorrhage:  None visualized. Maternal uterus/adnexae: The right ovary measures 2.7 x 1.8 x 3.0 cm and the left ovary measures 1.8 x 1.7 x 1.6 cm. The ovaries appear unremarkable. Small amount of free fluid noted within the pelvis. IMPRESSION: Tiny cystic structure within the endometrial canal may represent an early gestational sac. Correlation with clinical exam and follow-up with serial HCG levels and ultrasound recommended. Electronically Signed   By: Elgie Collard M.D.   On: 11/01/2015 21:25   US Ob Transvaginal  Result Date: 11/01/2015 CLINICAL DATA:  25 year old female with positive HCG levels presenting with spotting and cramping. EXAM: OBSTETRIC <14 WK [place] AND TRANSVAGINAL OB US TECHNIQUE: Both transabdominal and transvaginal ultrasound examinations were performed for complete evaluation of the gestation as well as the maternal uterus, adnexal regions, and pelvic cul-de-sac. Transvaginal technique was performed to assess early pregnancy. COMPARISON:  None for this pregnancy. FINDINGS: A small cystic structure noted within the endometrial canal with possible mild decidual reaction. No fetal pole or yolk sac identified within this structure. This may represent an early gestational sac, or a blighted ovum. A pseudo gestation of an ectopic pregnancy is not entirely excluded. Correlation with clinical exam and follow-up with serial HCG levels and ultrasound recommended. If this cystic structure is a true gestational sac the estimated gestational age based on mean sac diameter of 2 mm is 4 weeks, 6 days. Subchorionic hemorrhage:  None visualized. Maternal uterus/adnexae: The right ovary measures 2.7 x 1.8 x 3.0 cm and the left ovary measures 1.8 x 1.7 x 1.6 cm. The ovaries appear unremarkable. Small amount of free fluid noted within the pelvis. IMPRESSION: Tiny cystic structure within the endometrial canal may represent an early gestational sac. Correlation with clinical exam and follow-up with serial HCG  levels and ultrasound recommended. Electronically Signed   By: Elgie Collard M.D.   On: 11/01/2015 21:25    ASSESSMENT 1. Antepartum bleeding, first trimester   Abnormal rise in quant HCG, desired pregnancy  PLAN Discharge home in stable condition [place] in 2 days to confirm if failed pregnancy  Adam Phenix, MD  11/16/2015  2:43 PM

## 2015-11-17 ENCOUNTER — Other Ambulatory Visit: Payer: Medicaid Other

## 2015-11-17 ENCOUNTER — Encounter (HOSPITAL_COMMUNITY): Payer: Self-pay

## 2015-11-17 ENCOUNTER — Inpatient Hospital Stay (HOSPITAL_COMMUNITY): Payer: Medicaid Other

## 2015-11-17 ENCOUNTER — Telehealth: Payer: Self-pay

## 2015-11-17 ENCOUNTER — Inpatient Hospital Stay (HOSPITAL_COMMUNITY)
Admission: AD | Admit: 2015-11-17 | Discharge: 2015-11-17 | Disposition: A | Discharge status: Home / Self Care | Payer: Medicaid Other | Source: Ambulatory Visit | Attending: Family Medicine | Admitting: Family Medicine

## 2015-11-17 DIAGNOSIS — O209 Hemorrhage in early pregnancy, unspecified: Secondary | ICD-10-CM | POA: Insufficient documentation

## 2015-11-17 DIAGNOSIS — Z3A01 Less than 8 weeks gestation of pregnancy: Secondary | ICD-10-CM | POA: Diagnosis not present

## 2015-11-17 LAB — URINE MICROSCOPIC-ADD ON

## 2015-11-17 LAB — URINALYSIS, ROUTINE W REFLEX MICROSCOPIC
BILIRUBIN URINE: NEGATIVE
GLUCOSE, UA: NEGATIVE mg/dL
KETONES UR: NEGATIVE mg/dL
Nitrite: NEGATIVE
PROTEIN: NEGATIVE mg/dL
Specific Gravity, Urine: <1.005 — ABNORMAL LOW (ref 1.005–1.030)
pH: 5.5 (ref 5.0–8.0)

## 2015-11-17 LAB — CBC
HEMATOCRIT: 37 % (ref 36.0–46.0)
Hemoglobin: 12.9 g/dL (ref 12.0–15.0)
MCH: 30.1 pg (ref 26.0–34.0)
MCHC: 34.9 g/dL (ref 30.0–36.0)
MCV: 86.2 fL (ref 78.0–100.0)
PLATELETS: 227 10*3/uL (ref 150–400)
RBC: 4.29 MIL/uL (ref 3.87–5.11)
RDW: 14.1 % (ref 11.5–15.5)
WBC: 8.6 10*3/uL (ref 4.0–10.5)

## 2015-11-17 NOTE — MAU Note (Signed)
Pt c/o abdominal cramping like a period that started last night and continued through to this morning. Pt states she passed a clot about 3cm by 4cm at 0950 when she went to the restroom. Pt states she hasn't seen other bleeding.

## 2015-11-17 NOTE — Discharge Instructions (Signed)
Vaginal Bleeding During Pregnancy, First Trimester °A small amount of bleeding (spotting) from the vagina is relatively common in early pregnancy. It usually stops on its own. Various things may cause bleeding or spotting in early pregnancy. Some bleeding may be related to the pregnancy, and some may not. In most cases, the bleeding is normal and is not a problem. However, bleeding can also be a sign of something serious. Be sure to tell your health care provider about any vaginal bleeding right away. °Some possible causes of vaginal bleeding during the first trimester include: °· Infection or inflammation of the cervix. °· Growths (polyps) on the cervix. °· Miscarriage or threatened miscarriage. °· Pregnancy tissue has developed outside of the uterus and in a fallopian tube (tubal pregnancy). °· Tiny cysts have developed in the uterus instead of pregnancy tissue (molar pregnancy). °HOME CARE INSTRUCTIONS  °Watch your condition for any changes. The following actions may help to lessen any discomfort you are feeling: °· Follow your health care provider's instructions for limiting your activity. If your health care provider orders bed rest, you may need to stay in bed and only get up to use the bathroom. However, your health care provider may allow you to continue light activity. °· If needed, make plans for someone to help with your regular activities and responsibilities while you are on bed rest. °· Keep track of the number of pads you use each day, how often you change pads, and how soaked (saturated) they are. Write this down. °· Do not use tampons. Do not douche. °· Do not have sexual intercourse or orgasms until approved by your health care provider. °· If you pass any tissue from your vagina, save the tissue so you can show it to your health care provider. °· Only take over-the-counter or prescription medicines as directed by your health care provider. °· Do not take aspirin because it can make you  bleed. °· Keep all follow-up appointments as directed by your health care provider. °SEEK MEDICAL CARE IF: °· You have any vaginal bleeding during any part of your pregnancy. °· You have cramps or labor pains. °· You have a fever, not controlled by medicine. °SEEK IMMEDIATE MEDICAL CARE IF:  °· You have severe cramps in your back or belly (abdomen). °· You pass large clots or tissue from your vagina. °· Your bleeding increases. °· You feel light-headed or weak, or you have fainting episodes. °· You have chills. °· You are leaking fluid or have a gush of fluid from your vagina. °· You pass out while having a bowel movement. °MAKE SURE YOU: °· Understand these instructions. °· Will watch your condition. °· Will get help right away if you are not doing well or get worse. °  °This information is not intended to replace advice given to you by your health care provider. Make sure you discuss any questions you have with your health care provider. °  °Document Released: 11/02/2004 Document Revised: 01/28/2013 Document Reviewed: 09/30/2012 °Elsevier Interactive Patient Education ©2016 [employer] ° °Pelvic Rest °Pelvic rest is sometimes recommended for women when:  °· The placenta is partially or completely covering the opening of the cervix (placenta previa). °· There is bleeding between the uterine wall and the amniotic sac in the first trimester (subchorionic hemorrhage). °· The cervix begins to open without labor starting (incompetent cervix, cervical insufficiency). °· The labor is too early (preterm labor). °HOME CARE INSTRUCTIONS °· Do not have sexual intercourse, stimulation, or an orgasm. °· Do   not use tampons, douche, or put anything in the vagina. °· Do not lift anything over 10 pounds (4.5 kg). °· Avoid strenuous activity or straining your pelvic muscles. °SEEK MEDICAL CARE IF:  °· You have any vaginal bleeding during pregnancy. Treat this as a potential emergency. °· You have cramping pain felt low in the  stomach (stronger than menstrual cramps). °· You notice vaginal discharge (watery, mucus, or bloody). °· You have a low, dull backache. °· There are regular contractions or uterine tightening. °SEEK IMMEDIATE MEDICAL CARE IF: °You have vaginal bleeding and have placenta previa.  °  °This information is not intended to replace advice given to you by your health care provider. Make sure you discuss any questions you have with your health care provider. °  °Document Released: 05/20/2010 Document Revised: 04/17/2011 Document Reviewed: 07/27/2014 °Elsevier Interactive Patient Education ©2016 [employer] ° °

## 2015-11-17 NOTE — Telephone Encounter (Signed)
Pt called stated that since this morning she has had cramping, bleeding, and passed a clot. Advised patient to go to hospital for evaluation.

## 2015-11-17 NOTE — MAU Provider Note (Signed)
History     CSN: [id number]  Arrival date and time: 11/17/15 1048   First Provider Initiated Contact with Patient 11/17/15 1121      Chief Complaint  Patient presents with  . Abdominal Cramping  . Vaginal Bleeding   HPI   [name] [id number] @ 651w2d here in MAU with vaginal bleeding. She was seen in MAU on 9/25 for the same complaint.  She has been followed in [facility]'s office for Quants and they all have been abnormal. She is worried.  She passed a small clot this morning and was concerned she passed the pregnancy. She denies pain at this time, she does have occasional lower abdominal cramping.   OB History    Gravida Para Term Preterm AB Living   4 1 1  0 2 1   SAB TAB Ectopic Multiple Live Births   1 1 0 0 1      Past Medical History:  Diagnosis Date  . Anxiety   . Asthma   . Chlamydia 01/2010  . GERD (gastroesophageal reflux disease)   . Ovarian cyst     Past Surgical History:  Procedure Laterality Date  . DILATION AND CURETTAGE OF UTERUS    . DILATION AND EVACUATION  07/25/2011   Procedure: DILATATION AND EVACUATION;  Surgeon: Leslie AndreaJames E Tomblin II, MD;  Location: [facility];  Service: Gynecology;  Laterality: N/A;  . INDUCED ABORTION     x2    Family History  Problem Relation Age of Onset  . Anesthesia problems Neg Hx   . Other Neg Hx     Social History  Substance Use Topics  . Smoking status: Never Smoker  . Smokeless tobacco: Never Used  . Alcohol use No    Allergies: No Known Allergies  Prescriptions Prior to Admission  Medication Sig Dispense Refill Last Dose  . Prenat-FePoly-Metf-FA-DHA-DSS (VITAFOL FE+) 90-1-200 & 50 MG CPPK Take 2 tablets by mouth daily before breakfast. 60 each 11 Taking   Results for orders placed or performed during the hospital encounter of 11/17/15 (from the past 48 hour(s))  Urinalysis, Routine w reflex microscopic (not [facility])     Status: Abnormal   Collection Time: 11/17/15 10:55 AM  Result Value Ref Range    Color, Urine YELLOW YELLOW   APPearance CLEAR CLEAR   Specific Gravity, Urine <1.005 (L) 1.005 - 1.030   pH 5.5 5.0 - 8.0   Glucose, UA NEGATIVE NEGATIVE mg/dL   Hgb urine dipstick MODERATE (A) NEGATIVE   Bilirubin Urine NEGATIVE NEGATIVE   Ketones, ur NEGATIVE NEGATIVE mg/dL   Protein, ur NEGATIVE NEGATIVE mg/dL   Nitrite NEGATIVE NEGATIVE   Leukocytes, UA TRACE (A) NEGATIVE  Urine microscopic-add on     Status: Abnormal   Collection Time: 11/17/15 10:55 AM  Result Value Ref Range   Squamous Epithelial / LPF 0-5 (A) NONE SEEN   WBC, UA 0-5 0 - 5 WBC/hpf   RBC / HPF 0-5 0 - 5 RBC/hpf   Bacteria, UA FEW (A) NONE SEEN  CBC     Status: None   Collection Time: 11/17/15 11:35 AM  Result Value Ref Range   WBC 8.6 4.0 - 10.5 K/uL   RBC 4.29 3.87 - 5.11 MIL/uL   Hemoglobin 12.9 12.0 - 15.0 g/dL   HCT 13.037.0 86.536.0 - 78.446.0 %   MCV 86.2 78.0 - 100.0 fL   MCH 30.1 26.0 - 34.0 pg   MCHC 34.9 30.0 - 36.0 g/dL   RDW 69.614.1 29.511.5 -  15.5 %   Platelets 227 150 - 400 K/uL    US Ob Transvaginal  Result Date: 11/17/2015 CLINICAL DATA:  Vaginal bleeding in pregnancy. EXAM: TRANSVAGINAL OB ULTRASOUND TECHNIQUE: Transvaginal ultrasound was performed for complete evaluation of the gestation as well as the maternal uterus, adnexal regions, and pelvic cul-de-sac. COMPARISON:  None. FINDINGS: Intrauterine gestational sac: Single Yolk sac:  Visualized Embryo:  Visualized Cardiac Activity: Visualized Heart Rate: 124 bpm MSD: 5.4  mm   5 w   2  d CRL:   4.4  mm   6 w 1 d                  [place] EDC: 07/14/2016 Subchorionic hemorrhage:  None visualized. Maternal uterus/adnexae: No adnexal masses or free fluid. IMPRESSION: Single intrauterine gestation. The gestational sac seems somewhat small relative to the fetal pole. Estimated gestational age based on crown-rump oblique 6 weeks 1 day. No subchorionic hemorrhage. This could be followed with repeat ultrasound in 2 weeks to ensure expected progression. Electronically Signed    By: Charlett Nose M.D.   On: 11/17/2015 13:17   Review of Systems  Constitutional: Negative for chills and fever.  Gastrointestinal: Negative for abdominal pain, nausea and vomiting.  Genitourinary: Negative for dysuria, frequency and urgency.   Physical Exam   Blood pressure 110/64, pulse 80, temperature 98.4 F (36.9 C), temperature source Oral, resp. rate 17, height 5\' 5"  (1.651 m), weight 173 lb (78.5 kg), last menstrual period 09/27/2015, SpO2 99 %, unknown if currently breastfeeding.  Physical Exam  Constitutional: She is oriented to person, place, and time. She appears well-developed and well-nourished. No distress.  HENT:  Head: Normocephalic.  GI: Soft. She exhibits no distension. There is no tenderness. There is no rebound.  Genitourinary:  Genitourinary Comments: Vagina - Small amount of brown vaginal discharge, no odor  Cervix - No contact bleeding, no active bleeding  Bimanual exam: Cervix closed Uterus non tender, slightly enlarged  Adnexa non tender, no masses bilaterally Chaperone present for exam.   Musculoskeletal: Normal range of motion.  Neurological: She is alert and oriented to person, place, and time.  Skin: Skin is warm. She is not diaphoretic.  Psychiatric: Her behavior is normal.    MAU Course  Procedures  None  MDM  Pelvic US.  UA  Assessment and Plan    A:  1. Vaginal bleeding in pregnancy, first trimester   2. Vaginal bleeding in pregnancy, first trimester     P:  Discharge home in stable condition Bleeding precautions Pelvic rest Return to MAU if symptoms worsen Call us scheduling to cancel [place] that was scheduled for tomorrow. Message sent to [facility] to cancel follow up US visit. Patient made aware.   Duane Lope, NP 11/17/2015 5:34 PM

## 2015-11-18 ENCOUNTER — Encounter: Payer: Medicaid Other | Admitting: Obstetrics and Gynecology

## 2015-11-18 ENCOUNTER — Other Ambulatory Visit: Payer: Medicaid Other

## 2015-11-23 ENCOUNTER — Telehealth: Payer: Self-pay | Admitting: *Deleted

## 2015-11-23 NOTE — Telephone Encounter (Signed)
Patient called to let Dr Clearance CootsHarper know that she is still bleeding. It has been bright red for 2 days. She is changing a pad twice daily. She is cramping and passing some small clots, but no tissue at this time. Per Dr Clearance CootsHarper- patient instructed to watch and wait. If her bleeding increases to 1 pad/hour, if she starts to pass tissue, or if she becomes concerned she needs to go to MAU for evaluation. Prognosis for continuation of pregnancy does not look good.

## 2015-11-29 ENCOUNTER — Other Ambulatory Visit: Payer: Self-pay | Admitting: Obstetrics

## 2015-11-29 ENCOUNTER — Telehealth: Payer: Self-pay

## 2015-11-29 ENCOUNTER — Ambulatory Visit: Payer: Medicaid Other | Admitting: Obstetrics

## 2015-11-29 NOTE — Telephone Encounter (Signed)
Called patient in regards to questions she had about previous Doctor appointment. Patient didn't understand so call transferred to Dr. Clearance CootsHarper and Dr. Clearance CootsHarper spoke with patient.

## 2015-12-08 ENCOUNTER — Encounter: Payer: Medicaid Other | Admitting: Obstetrics and Gynecology

## 2015-12-09 ENCOUNTER — Encounter: Payer: Medicaid Other | Admitting: Obstetrics & Gynecology

## 2015-12-21 ENCOUNTER — Ambulatory Visit: Payer: Self-pay | Admitting: Certified Nurse Midwife

## 2016-04-23 ENCOUNTER — Encounter (HOSPITAL_COMMUNITY): Payer: Self-pay

## 2016-04-23 ENCOUNTER — Inpatient Hospital Stay (HOSPITAL_COMMUNITY): Payer: Medicaid Other

## 2016-04-23 ENCOUNTER — Inpatient Hospital Stay (HOSPITAL_COMMUNITY)
Admission: AD | Admit: 2016-04-23 | Discharge: 2016-04-23 | Disposition: A | Discharge status: Home / Self Care | Payer: Medicaid Other | Source: Ambulatory Visit | Attending: Family Medicine | Admitting: Family Medicine

## 2016-04-23 DIAGNOSIS — R109 Unspecified abdominal pain: Secondary | ICD-10-CM | POA: Diagnosis present

## 2016-04-23 DIAGNOSIS — O99612 Diseases of the digestive system complicating pregnancy, second trimester: Secondary | ICD-10-CM | POA: Insufficient documentation

## 2016-04-23 DIAGNOSIS — R509 Fever, unspecified: Secondary | ICD-10-CM | POA: Insufficient documentation

## 2016-04-23 DIAGNOSIS — Z3A01 Less than 8 weeks gestation of pregnancy: Secondary | ICD-10-CM | POA: Diagnosis not present

## 2016-04-23 DIAGNOSIS — Z20828 Contact with and (suspected) exposure to other viral communicable diseases: Secondary | ICD-10-CM | POA: Diagnosis not present

## 2016-04-23 DIAGNOSIS — K219 Gastro-esophageal reflux disease without esophagitis: Secondary | ICD-10-CM | POA: Diagnosis not present

## 2016-04-23 DIAGNOSIS — O219 Vomiting of pregnancy, unspecified: Secondary | ICD-10-CM | POA: Insufficient documentation

## 2016-04-23 DIAGNOSIS — O26899 Other specified pregnancy related conditions, unspecified trimester: Secondary | ICD-10-CM

## 2016-04-23 DIAGNOSIS — O26891 Other specified pregnancy related conditions, first trimester: Secondary | ICD-10-CM | POA: Insufficient documentation

## 2016-04-23 DIAGNOSIS — O9989 Other specified diseases and conditions complicating pregnancy, childbirth and the puerperium: Secondary | ICD-10-CM

## 2016-04-23 DIAGNOSIS — Z3491 Encounter for supervision of normal pregnancy, unspecified, first trimester: Secondary | ICD-10-CM

## 2016-04-23 LAB — URINALYSIS, ROUTINE W REFLEX MICROSCOPIC
Bilirubin Urine: NEGATIVE
GLUCOSE, UA: NEGATIVE mg/dL
Hgb urine dipstick: NEGATIVE
Ketones, ur: 5 mg/dL — AB
Leukocytes, UA: NEGATIVE
NITRITE: NEGATIVE
PH: 5 (ref 5.0–8.0)
Protein, ur: NEGATIVE mg/dL
SPECIFIC GRAVITY, URINE: 1.027 (ref 1.005–1.030)

## 2016-04-23 LAB — CBC
HEMATOCRIT: 38.5 % (ref 36.0–46.0)
Hemoglobin: 13.1 g/dL (ref 12.0–15.0)
MCH: 29.4 pg (ref 26.0–34.0)
MCHC: 34 g/dL (ref 30.0–36.0)
MCV: 86.3 fL (ref 78.0–100.0)
Platelets: 254 10*3/uL (ref 150–400)
RBC: 4.46 MIL/uL (ref 3.87–5.11)
RDW: 13.8 % (ref 11.5–15.5)
WBC: 9 10*3/uL (ref 4.0–10.5)

## 2016-04-23 LAB — POCT PREGNANCY, URINE: Preg Test, Ur: POSITIVE — AB

## 2016-04-23 LAB — HCG, QUANTITATIVE, PREGNANCY: HCG, BETA CHAIN, QUANT, S: 75205 m[IU]/mL — AB (ref <5)

## 2016-04-23 LAB — INFLUENZA PANEL BY PCR (TYPE A & B)
INFLAPCR: NEGATIVE
Influenza B By PCR: NEGATIVE

## 2016-04-23 MED ORDER — OSELTAMIVIR PHOSPHATE 75 MG PO CAPS: 75.0000 mg | ORAL_CAPSULE | Freq: Every day | ORAL | 0 refills | Status: DC

## 2016-04-23 MED ORDER — PROMETHAZINE HCL 25 MG PO TABS: 25.0000 mg | ORAL_TABLET | Freq: Four times a day (QID) | ORAL | 0 refills | Status: DC | PRN

## 2016-04-23 MED ORDER — PROMETHAZINE HCL 25 MG PO TABS
25.0000 mg | ORAL_TABLET | Freq: Once | ORAL | Status: AC
  Administered 2016-04-23: 25 mg via ORAL
  Filled 2016-04-23: qty 1

## 2016-04-23 NOTE — MAU Note (Signed)
Onset of vomiting since yesterday, denies diarrhea, T 102.3

## 2016-04-23 NOTE — [facility] Provider Note (Signed)
History     CSN: [id number]  Arrival date and time: 04/23/16 1401  First Provider Initiated Contact with Patient 04/23/16 1427      Chief Complaint  Patient presents with  . Fever  . Emesis During Pregnancy  . Abdominal Cramping   HPI Angela Riley is a 26 y.o. [id number] at [redacted]w[redacted]d by LMP who presents with n/v, abdominal cramping, & fever. Reports lower abdominal cramping that comes & goes x 1 week. Rates pain 6/10. Has not treated. Nausea & vomiting began yesterday. Has vomited twice today & is "mildly" nauseated currently. Does not have antiemetic at home. Was with family yesterday & exposed to pneumonia & the flu. Reports fever of 102.3 prior to come to [facility]. Has not taken anything for fever.  Denies cough, sore throat, ear pain. Denies diarrhea, or constipation; last BM was Friday. Denies vaginal bleeding, vaginal discharge, or dysuria.   OB History    Gravida Para Term Preterm AB Living   4 1 1  0 2 1   SAB TAB Ectopic Multiple Live Births   1 1 0 0 1      Past Medical History:  Diagnosis Date  . Anxiety   . Asthma   . Chlamydia 01/2010  . GERD (gastroesophageal reflux disease)   . Ovarian cyst     Past Surgical History:  Procedure Laterality Date  . DILATION AND CURETTAGE OF UTERUS    . DILATION AND EVACUATION  07/25/2011   Procedure: DILATATION AND EVACUATION;  Surgeon: Leslie Andrea, MD;  Location: [facility];  Service: Gynecology;  Laterality: N/A;  . INDUCED ABORTION     x2    Family History  Problem Relation Age of Onset  . Anesthesia problems Neg Hx   . Other Neg Hx     Social History  Substance Use Topics  . Smoking status: Never Smoker  . Smokeless tobacco: Never Used  . Alcohol use No    Allergies: No Known Allergies  Prescriptions Prior to Admission  Medication Sig Dispense Refill Last Dose  . omeprazole (PRILOSEC) 40 MG capsule Take by mouth daily.  0   . Prenat-FePoly-Metf-FA-DHA-DSS (VITAFOL FE+) 90-1-200 & 50 MG CPPK Take 1 tablet by mouth  daily.   11/17/2015 at Unknown time  . promethazine (PHENERGAN) 25 MG tablet Take 25 mg by mouth every 6 (six) hours as needed.  0     Review of Systems  Constitutional: Positive for fever. Negative for chills.  HENT: Negative.  Negative for ear pain, sinus pain and sore throat.   Respiratory: Negative for cough.   Gastrointestinal: Positive for abdominal pain, nausea and vomiting. Negative for constipation and diarrhea.  Genitourinary: Negative.   Neurological: Negative for headaches.   Physical Exam   Blood pressure (!) 128/58, pulse 84, temperature 98.2 F (36.8 C), resp. rate 18, height 5\' 5"  (1.651 m), weight 175 lb 1.9 oz (79.4 kg), last menstrual period 03/09/2016, unknown if currently breastfeeding.  Physical Exam  Nursing note and vitals reviewed. Constitutional: She is oriented to person, place, and time. She appears well-developed and well-nourished. No distress.  HENT:  Head: Normocephalic and atraumatic.  Eyes: Conjunctivae are normal. Right eye exhibits no discharge. Left eye exhibits no discharge. No scleral icterus.  Neck: Normal range of motion.  Cardiovascular: Normal rate, regular rhythm and normal heart sounds.   No murmur heard. Respiratory: Effort normal and breath sounds normal. No respiratory distress. She has no wheezes.  GI: Soft. Bowel sounds are normal. She exhibits  no distension. There is no tenderness. There is no rebound and no guarding.  Neurological: She is alert and oriented to person, place, and time.  Skin: Skin is warm and dry. She is not diaphoretic.  Psychiatric: She has a normal mood and affect. Her behavior is normal. Judgment and thought content normal.    MAU Course  Procedures Results for orders placed or performed during the hospital encounter of 04/23/16 (from the past 24 hour(s))  Urinalysis, Routine w reflex microscopic     Status: Abnormal   Collection Time: 04/23/16  2:10 PM  Result Value Ref Range   Color, Urine YELLOW YELLOW    APPearance HAZY (A) CLEAR   Specific Gravity, Urine 1.027 1.005 - 1.030   pH 5.0 5.0 - 8.0   Glucose, UA NEGATIVE NEGATIVE mg/dL   Hgb urine dipstick NEGATIVE NEGATIVE   Bilirubin Urine NEGATIVE NEGATIVE   Ketones, ur 5 (A) NEGATIVE mg/dL   Protein, ur NEGATIVE NEGATIVE mg/dL   Nitrite NEGATIVE NEGATIVE   Leukocytes, UA NEGATIVE NEGATIVE  Pregnancy, urine POC     Status: Abnormal   Collection Time: 04/23/16  2:14 PM  Result Value Ref Range   Preg Test, Ur POSITIVE (A) NEGATIVE  Influenza panel by PCR (type A & B)     Status: None   Collection Time: 04/23/16  2:36 PM  Result Value Ref Range   Influenza A By PCR NEGATIVE NEGATIVE   Influenza B By PCR NEGATIVE NEGATIVE  CBC     Status: None   Collection Time: 04/23/16  2:44 PM  Result Value Ref Range   WBC 9.0 4.0 - 10.5 K/uL   RBC 4.46 3.87 - 5.11 MIL/uL   Hemoglobin 13.1 12.0 - 15.0 g/dL   HCT 29.538.5 62.136.0 - 30.846.0 %   MCV 86.3 78.0 - 100.0 fL   MCH 29.4 26.0 - 34.0 pg   MCHC 34.0 30.0 - 36.0 g/dL   RDW 65.713.8 84.611.5 - 96.215.5 %   Platelets 254 150 - 400 K/uL  hCG, quantitative, pregnancy     Status: Abnormal   Collection Time: 04/23/16  2:44 PM  Result Value Ref Range   hCG, Beta Chain, Quant, S 75,205 (H) <5 mIU/mL   [place] Ob Comp Less 14 Wks  Result Date: 04/23/2016 CLINICAL DATA:  Maternal fever and vomiting with cramping. Quantitative beta HCG 75,205. Gestational age by last menstrual period 6 weeks 3 days. EXAM: OBSTETRIC <14 WK US AND TRANSVAGINAL OB US TECHNIQUE: Both transabdominal and transvaginal ultrasound examinations were performed for complete evaluation of the gestation as well as the maternal uterus, adnexal regions, and pelvic cul-de-sac. Transvaginal technique was performed to assess early pregnancy. COMPARISON:  None. FINDINGS: Intrauterine gestational sac: Present, single Yolk sac:  Present Embryo:  Present Cardiac Activity: Present Heart Rate: 124  bpm CRL:  6  mm   6 w   2 d                  US EDC: 12/15/2016  Subchorionic hemorrhage:  None visualized. Maternal uterus/adnexae: Left corpus luteum noted. Trace free pelvic fluid. IMPRESSION: 1. Single living intrauterine pregnancy measuring at 6 weeks 2 days gestation. No visible complicating feature. Electronically Signed   By: Gaylyn RongWalter  Liebkemann M.D.   On: 04/23/2016 16:20   [place] Ob Transvaginal  Result Date: 04/23/2016 CLINICAL DATA:  Maternal fever and vomiting with cramping. Quantitative beta HCG 75,205. Gestational age by last menstrual period 6 weeks 3 days. EXAM: OBSTETRIC <14 WK US AND  TRANSVAGINAL OB US TECHNIQUE: Both transabdominal and transvaginal ultrasound examinations were performed for complete evaluation of the gestation as well as the maternal uterus, adnexal regions, and pelvic cul-de-sac. Transvaginal technique was performed to assess early pregnancy. COMPARISON:  None. FINDINGS: Intrauterine gestational sac: Present, single Yolk sac:  Present Embryo:  Present Cardiac Activity: Present Heart Rate: 124  bpm CRL:  6  mm   6 w   2 d                  [place] EDC: 12/15/2016 Subchorionic hemorrhage:  None visualized. Maternal uterus/adnexae: Left corpus luteum noted. Trace free pelvic fluid. IMPRESSION: 1. Single living intrauterine pregnancy measuring at 6 weeks 2 days gestation. No visible complicating feature. Electronically Signed   By: Gaylyn Rong M.D.   On: 04/23/2016 16:20    MDM +UPT UA, CBC, ABO/Rh, quant hCG, HIV, and [place] today to rule out ectopic pregnancy Flu swab collected VSS, pt afebrile Flu swab negative Ultrasound shows SIUP with cardiac activity Phenergan 25 mg PO Assessment and Plan  A: 1. Normal IUP (intrauterine pregnancy) on prenatal ultrasound, first trimester   2. Abdominal pain affecting pregnancy   3. Nausea and vomiting during pregnancy prior to [redacted] weeks gestation   4. Exposure to influenza    P: Discharge home Rx Phengergan & tamiflu prophylaxis Discussed reasons to return to [facility]U Keep follow up appointment  with OB/PCP   Judeth Horn 04/23/2016, 2:27 PM

## 2016-04-23 NOTE — Discharge Instructions (Signed)
Abdominal Pain During Pregnancy Belly (abdominal) pain is common during pregnancy. Most of the time, it is not a serious problem. Other times, it can be a sign that something is wrong with the pregnancy. Always tell your doctor if you have belly pain. Follow these instructions at home: Monitor your belly pain for any changes. The following actions may help you feel better:  Do not have sex (intercourse) or put anything in your vagina until you feel better.  Rest until your pain stops.  Drink clear fluids if you feel sick to your stomach (nauseous). Do not eat solid food until you feel better.  Only take medicine as told by your doctor.  Keep all doctor visits as told. Get help right away if:  You are bleeding, leaking fluid, or pieces of tissue come out of your vagina.  You have more pain or cramping.  You keep throwing up (vomiting).  You have pain when you pee (urinate) or have blood in your pee.  You have a fever.  You do not feel your baby moving as much.  You feel very weak or feel like passing out.  You have trouble breathing, with or without belly pain.  You have a very bad headache and belly pain.  You have fluid leaking from your vagina and belly pain.  You keep having watery poop (diarrhea).  Your belly pain does not go away after resting, or the pain gets worse. This information is not intended to replace advice given to you by your health care provider. Make sure you discuss any questions you have with your health care provider. Document Released: 01/11/2009 Document Revised: 09/01/2015 Document Reviewed: 08/22/2012 Elsevier Interactive Patient Education  2017 [employer]  Morning Sickness Morning sickness is when you feel sick to your stomach (nauseous) during pregnancy. This nauseous feeling may or may not come with vomiting. It often occurs in the morning but can be a problem any time of day. Morning sickness is most common during the first trimester, but  it may continue throughout pregnancy. While morning sickness is unpleasant, it is usually harmless unless you develop severe and continual vomiting (hyperemesis gravidarum). This condition requires more intense treatment. What are the causes? The cause of morning sickness is not completely known but seems to be related to normal hormonal changes that occur in pregnancy. What increases the risk? You are at greater risk if you:  Experienced nausea or vomiting before your pregnancy.  Had morning sickness during a previous pregnancy.  Are pregnant with more than one baby, such as twins. How is this treated? Do not use any medicines (prescription, over-the-counter, or herbal) for morning sickness without first talking to your health care provider. Your health care provider may prescribe or recommend:  Vitamin B6 supplements.  Anti-nausea medicines.  The herbal medicine ginger. Follow these instructions at home:  Only take over-the-counter or prescription medicines as directed by your health care provider.  Taking multivitamins before getting pregnant can prevent or decrease the severity of morning sickness in most women.  Eat a piece of dry toast or unsalted crackers before getting out of bed in the morning.  Eat five or six small meals a day.  Eat dry and bland foods (rice, baked potato). Foods high in carbohydrates are often helpful.  Do not drink liquids with your meals. Drink liquids between meals.  Avoid greasy, fatty, and spicy foods.  Get someone to cook for you if the smell of any food causes nausea and vomiting.  If you feel nauseous after taking prenatal vitamins, take the vitamins at night or with a snack.  Snack on protein foods (nuts, yogurt, cheese) between meals if you are hungry.  Eat unsweetened gelatins for desserts.  Wearing an acupressure wristband (worn for sea sickness) may be helpful.  Acupuncture may be helpful.  Do not smoke.  Get a humidifier to  keep the air in your house free of odors.  Get plenty of fresh air. Contact a health care provider if:  Your home remedies are not working, and you need medicine.  You feel dizzy or lightheaded.  You are losing weight. Get help right away if:  You have persistent and uncontrolled nausea and vomiting.  You pass out (faint). This information is not intended to replace advice given to you by your health care provider. Make sure you discuss any questions you have with your health care provider. Document Released: 03/16/2006 Document Revised: 07/01/2015 Document Reviewed: 07/10/2012 Elsevier Interactive Patient Education  2017 [employer] Inc.  Influenza, Adult Influenza (the flu") is an infection in the lungs, nose, and throat (respiratory tract). It is caused by a virus. The flu causes many common cold symptoms, as well as a high fever and body aches. It can make you feel very sick. The flu spreads easily from person to person (is contagious). Getting a flu shot (influenza vaccination) every year is the best way to prevent the flu. Follow these instructions at home:  Take over-the-counter and prescription medicines only as told by your doctor.  Use a cool mist humidifier to add moisture (humidity) to the air in your home. This can make it easier to breathe.  Rest as needed.  Drink enough fluid to keep your pee (urine) clear or pale yellow.  Cover your mouth and nose when you cough or sneeze.  Wash your hands with soap and water often, especially after you cough or sneeze. If you cannot use soap and water, use hand sanitizer.  Stay home from work or school as told by your doctor. Unless you are visiting your doctor, try to avoid leaving home until your fever has been gone for 24 hours without the use of medicine.  Keep all follow-up visits as told by your doctor. This is important. How is this prevented?  Getting a yearly (annual) flu shot is the best way to avoid getting the flu.  You may get the flu shot in late summer, fall, or winter. Ask your doctor when you should get your flu shot.  Wash your hands often or use hand sanitizer often.  Avoid contact with people who are sick during cold and flu season.  Eat healthy foods.  Drink plenty of fluids.  Get enough sleep.  Exercise regularly. Contact a doctor if:  You get new symptoms.  You have:  Chest pain.  Watery poop (diarrhea).  A fever.  Your cough gets worse.  You start to have more mucus.  You feel sick to your stomach (nauseous).  You throw up (vomit). Get help right away if:  You start to be short of breath or have trouble breathing.  Your skin or nails turn a bluish color.  You have very bad pain or stiffness in your neck.  You get a sudden headache.  You get sudden pain in your face or ear.  You cannot stop throwing up. This information is not intended to replace advice given to you by your health care provider. Make sure you discuss any questions you have with  your health care provider. Document Released: 11/02/2007 Document Revised: 07/01/2015 Document Reviewed: 11/17/2014 Elsevier Interactive Patient Education  2017 [employer]

## 2016-05-15 ENCOUNTER — Encounter: Payer: Self-pay | Admitting: Certified Nurse Midwife

## 2016-05-15 ENCOUNTER — Other Ambulatory Visit (HOSPITAL_COMMUNITY)
Admission: RE | Admit: 2016-05-15 | Discharge: 2016-05-15 | Disposition: A | Discharge status: Home / Self Care | Payer: Medicaid Other | Source: Ambulatory Visit | Attending: Certified Nurse Midwife | Admitting: Certified Nurse Midwife

## 2016-05-15 ENCOUNTER — Ambulatory Visit (INDEPENDENT_AMBULATORY_CARE_PROVIDER_SITE_OTHER): Payer: Medicaid Other | Admitting: Certified Nurse Midwife

## 2016-05-15 VITALS — BP 112/65 | HR 84 | Wt 171.8 lb

## 2016-05-15 DIAGNOSIS — Z3481 Encounter for supervision of other normal pregnancy, first trimester: Secondary | ICD-10-CM

## 2016-05-15 DIAGNOSIS — Z3A09 9 weeks gestation of pregnancy: Secondary | ICD-10-CM | POA: Insufficient documentation

## 2016-05-15 DIAGNOSIS — O99211 Obesity complicating pregnancy, first trimester: Secondary | ICD-10-CM | POA: Diagnosis not present

## 2016-05-15 DIAGNOSIS — O3441 Maternal care for other abnormalities of cervix, first trimester: Secondary | ICD-10-CM | POA: Insufficient documentation

## 2016-05-15 DIAGNOSIS — R87610 Atypical squamous cells of undetermined significance on cytologic smear of cervix (ASC-US): Secondary | ICD-10-CM | POA: Insufficient documentation

## 2016-05-15 DIAGNOSIS — E6609 Other obesity due to excess calories: Secondary | ICD-10-CM

## 2016-05-15 DIAGNOSIS — O219 Vomiting of pregnancy, unspecified: Secondary | ICD-10-CM | POA: Diagnosis not present

## 2016-05-15 DIAGNOSIS — Z3490 Encounter for supervision of normal pregnancy, unspecified, unspecified trimester: Secondary | ICD-10-CM | POA: Insufficient documentation

## 2016-05-15 DIAGNOSIS — Z3480 Encounter for supervision of other normal pregnancy, unspecified trimester: Secondary | ICD-10-CM

## 2016-05-15 MED ORDER — DOXYLAMINE-PYRIDOXINE 10-10 MG PO TBEC: DELAYED_RELEASE_TABLET | ORAL | 4 refills | Status: DC

## 2016-05-15 NOTE — Progress Notes (Signed)
Patient is having some cramping.

## 2016-05-15 NOTE — Progress Notes (Signed)
Subjective:    Angela Riley is being seen today for her first obstetrical visit.  This is a planned pregnancy. She is at [redacted]w[redacted]d gestation. Her obstetrical history is significant for none. Relationship with FOB: significant other, living together. Patient does intend to breast feed. Pregnancy history fully reviewed.  The information documented in the HPI was reviewed and verified.  Menstrual History: OB History    Gravida Para Term Preterm AB Living   0 2 1   SAB TAB Ectopic Multiple Live Births   1 1 0 0 1       Patient's last menstrual period was 03/08/2016 (exact date).    Past Medical History:  Diagnosis Date  . Anxiety   . Asthma   . Chlamydia 01/2010  . GERD (gastroesophageal reflux disease)   . Ovarian cyst     Past Surgical History:  Procedure Laterality Date  . DILATION AND CURETTAGE OF UTERUS    . DILATION AND EVACUATION  07/25/2011   Procedure: DILATATION AND EVACUATION;  Surgeon: Leslie Andrea, MD;  Location: [facility];  Service: Gynecology;  Laterality: N/A;  . INDUCED ABORTION     x2     (Not in a hospital admission) No Known Allergies  Social History  Substance Use Topics  . Smoking status: Never Smoker  . Smokeless tobacco: Never Used  . Alcohol use No    Family History  Problem Relation Age of Onset  . Anesthesia problems Neg Hx   . Other Neg Hx      Review of Systems Constitutional: negative for weight loss Gastrointestinal: +for vomiting, + nausea Genitourinary:negative for genital lesions and vaginal discharge and dysuria Musculoskeletal:negative for back pain Behavioral/Psych: negative for abusive relationship, depression, illegal drug usage and tobacco use    Objective:    BP 112/65   Pulse 84   Wt 171 lb 12.8 oz (77.9 kg)   LMP 03/08/2016 (Exact Date)   BMI 28.59 kg/m  General Appearance:    Alert, cooperative, no distress, appears stated age  Head:    Normocephalic, without obvious abnormality, atraumatic  Eyes:     PERRL, conjunctiva/corneas clear, EOM's intact, fundi    benign, both eyes  Ears:    Normal TM's and external ear canals, both ears  Nose:   Nares normal, septum midline, mucosa normal, no drainage    or sinus tenderness  Throat:   Lips, mucosa, and tongue normal; teeth and gums normal  Neck:   Supple, symmetrical, trachea midline, no adenopathy;    thyroid:  no enlargement/tenderness/nodules; no carotid   bruit or JVD  Back:     Symmetric, no curvature, ROM normal, no CVA tenderness  Lungs:     Clear to auscultation bilaterally, respirations unlabored  Chest Wall:    No tenderness or deformity   Heart:    Regular rate and rhythm, S1 and S2 normal, no murmur, rub   or gallop  Breast Exam:    No tenderness, masses, or nipple abnormality  Abdomen:     Soft, non-tender, bowel sounds active all four quadrants,    no masses, no organomegaly  Genitalia:    Normal female without lesion, discharge or tenderness  Extremities:   Extremities normal, atraumatic, no cyanosis or edema  Pulses:   2+ and symmetric all extremities  Skin:   Skin color, texture, turgor normal, no rashes or lesions  Lymph nodes:   Cervical, supraclavicular, and axillary nodes normal  Neurologic:   CNII-XII intact, normal  strength, sensation and reflexes    throughout          Cervix:   Long, thick, closed and posterior.   FHR confirmed with [place].      Lab Review Urine pregnancy test Labs reviewed yes Radiologic studies reviewed yes Assessment:    Pregnancy at [redacted]w[redacted]d weeks   Supervision of other normal pregnancy, antepartum - Plan: Cytology - PAP, Cervicovaginal ancillary only, TSH, Hemoglobinopathy evaluation, Varicella zoster antibody, IgG, Culture, OB Urine, MaterniT21 PLUS Core+SCA, Hemoglobin A1c, Obstetric Panel, Including HIV, Cystic Fibrosis Mutation 97  Nausea and vomiting during pregnancy prior to [redacted] weeks gestation - Plan: Doxylamine-Pyridoxine (DICLEGIS) 10-10 MG TBEC  Obesity due to excess calories without  serious comorbidity, unspecified classification   Plan:      Prenatal vitamins.  Counseling provided regarding continued use of seat belts, cessation of alcohol consumption, smoking or use of illicit drugs; infection precautions i.e., influenza/TDAP immunizations, toxoplasmosis,CMV, parvovirus, listeria and varicella; workplace safety, exercise during pregnancy; routine dental care, safe medications, sexual activity, hot tubs, saunas, pools, travel, caffeine use, fish and methlymercury, potential toxins, hair treatments, varicose veins Weight gain recommendations per IOM guidelines reviewed: underweight/BMI< 18.5--> gain 28 - 40 lbs; normal weight/BMI 18.5 - 24.9--> gain 25 - 35 lbs; overweight/BMI 25 - 29.9--> gain 15 - 25 lbs; obese/BMI >30->gain  11 - 20 lbs Problem list reviewed and updated. FIRST/CF mutation testing/NIPT/QUAD SCREEN/fragile X/Ashkenazi Jewish population testing/Spinal muscular atrophy discussed: ordered. Role of ultrasound in pregnancy discussed; fetal survey: requested. Amniocentesis discussed: not indicated. VBAC calculator score: VBAC consent form provided Meds ordered this encounter  Medications  . Doxylamine-Pyridoxine (DICLEGIS) 10-10 MG TBEC    Sig: Take 1 tablet with breakfast and lunch.  Take 2 tablets at bedtime.    Dispense:  100 tablet    Refill:  4   Orders Placed This Encounter  Procedures  . Culture, OB Urine  . Result  . TSH  . Hemoglobinopathy evaluation  . Varicella zoster antibody, IgG  . MaterniT21 PLUS Core+SCA    Order Specific Question:   Is the patient insulin dependent?    Answer:   No    Order Specific Question:   Please enter gestational age. This should be expressed as weeks AND days, i.e. 16w 6d. Enter weeks here. Enter days in next question.    Answer:   43    Order Specific Question:   Please enter gestational age. This should be expressed as weeks AND days, i.e. 16w 6d. Enter days here. Enter weeks in previous question.     Answer:   5    Order Specific Question:   How was gestational age calculated?    Answer:   Ultrasound    Order Specific Question:   Please give the date of LMP OR Ultrasound OR Estimated date of delivery.    Answer:   12/13/2016    Order Specific Question:   Number of Fetuses (Type of Pregnancy):    Answer:   1    Order Specific Question:   Indications for performing the test? (please choose all that apply):    Answer:   Routine screening    Order Specific Question:   Other Indications? (Y=Yes, N=No)    Answer:   N    Order Specific Question:   If this is a repeat specimen, please indicate the reason:    Answer:   Not indicated    Order Specific Question:   Please specify the patient's race: (C=White/Caucasion, B=Black, I=Native American,  A=Asian, H=Hispanic, O=Other, U=Unknown)    Answer:   B    Order Specific Question:   Donor Egg - indicate if the egg was obtained from in vitro fertilization.    Answer:   N    Order Specific Question:   Age of Egg Donor.    Answer:   60    Order Specific Question:   Prior Down Syndrome/ONTD screening during current pregnancy.    Answer:   N    Order Specific Question:   Prior First Trimester Testing    Answer:   N    Order Specific Question:   Prior Second Trimester Testing    Answer:   N    Order Specific Question:   Family History of Neural Tube Defects    Answer:   N    Order Specific Question:   Prior Pregnancy with Down Syndrome    Answer:   N    Order Specific Question:   Please give the patient's weight (in pounds)    Answer:   172  . Hemoglobin A1c  . Obstetric Panel, Including HIV  . Cystic Fibrosis Mutation 97    Follow up in 4 weeks. 50% of 45 min visit spent on counseling and coordination of care.

## 2016-05-16 LAB — CERVICOVAGINAL ANCILLARY ONLY
Bacterial vaginitis: NEGATIVE
Candida vaginitis: NEGATIVE
Chlamydia: NEGATIVE
Neisseria Gonorrhea: NEGATIVE
Trichomonas: NEGATIVE

## 2016-05-17 DIAGNOSIS — E669 Obesity, unspecified: Secondary | ICD-10-CM | POA: Insufficient documentation

## 2016-05-17 LAB — CULTURE, OB URINE

## 2016-05-17 LAB — URINE CULTURE, OB REFLEX

## 2016-05-19 LAB — OBSTETRIC PANEL, INCLUDING HIV
ANTIBODY SCREEN: NEGATIVE
Basophils Absolute: 0 10*3/uL (ref 0.0–0.2)
Basos: 0 %
EOS (ABSOLUTE): 0.2 10*3/uL (ref 0.0–0.4)
Eos: 3 %
HIV SCREEN 4TH GENERATION: NONREACTIVE
Hematocrit: 39 % (ref 34.0–46.6)
Hemoglobin: 13.2 g/dL (ref 11.1–15.9)
Hepatitis B Surface Ag: NEGATIVE
IMMATURE GRANULOCYTES: 0 %
Immature Grans (Abs): 0 10*3/uL (ref 0.0–0.1)
Lymphocytes Absolute: 2 10*3/uL (ref 0.7–3.1)
Lymphs: 24 %
MCH: 29.5 pg (ref 26.6–33.0)
MCHC: 33.8 g/dL (ref 31.5–35.7)
MCV: 87 fL (ref 79–97)
MONOCYTES: 7 %
Monocytes Absolute: 0.5 10*3/uL (ref 0.1–0.9)
NEUTROS PCT: 66 %
Neutrophils Absolute: 5.3 10*3/uL (ref 1.4–7.0)
Platelets: 250 10*3/uL (ref 150–379)
RBC: 4.48 x10E6/uL (ref 3.77–5.28)
RDW: 14.4 % (ref 12.3–15.4)
RH TYPE: POSITIVE
RPR Ser Ql: NONREACTIVE
Rubella Antibodies, IGG: 3.23 index (ref >0.99)
WBC: 8 10*3/uL (ref 3.4–10.8)

## 2016-05-19 LAB — HEMOGLOBINOPATHY EVALUATION
HGB A: 97.9 % (ref 96.4–98.8)
HGB C: 0 %
HGB S: 0 %
HGB VARIANT: 0 %
Hemoglobin A2 Quantitation: 2.1 % (ref 1.8–3.2)
Hemoglobin F Quantitation: 0 % (ref 0.0–2.0)

## 2016-05-19 LAB — CYSTIC FIBROSIS MUTATION 97: Interpretation: NOT DETECTED

## 2016-05-19 LAB — VARICELLA ZOSTER ANTIBODY, IGG: Varicella zoster IgG: 1331 index (ref >165)

## 2016-05-19 LAB — CYTOLOGY - PAP
Diagnosis: UNDETERMINED — AB
HPV: NOT DETECTED

## 2016-05-19 LAB — HEMOGLOBIN A1C
ESTIMATED AVERAGE GLUCOSE: 105 mg/dL
HEMOGLOBIN A1C: 5.3 % (ref 4.8–5.6)

## 2016-05-19 LAB — TSH: TSH: 1.51 u[IU]/mL (ref 0.450–4.500)

## 2016-05-20 LAB — MATERNIT21 PLUS CORE+SCA
CHROMOSOME 18: NEGATIVE
Chromosome 13: NEGATIVE
Chromosome 21: NEGATIVE
Y Chromosome: DETECTED

## 2016-05-23 ENCOUNTER — Other Ambulatory Visit: Payer: Self-pay | Admitting: Certified Nurse Midwife

## 2016-05-23 DIAGNOSIS — R87610 Atypical squamous cells of undetermined significance on cytologic smear of cervix (ASC-US): Secondary | ICD-10-CM | POA: Insufficient documentation

## 2016-05-23 DIAGNOSIS — Z3480 Encounter for supervision of other normal pregnancy, unspecified trimester: Secondary | ICD-10-CM

## 2016-06-19 ENCOUNTER — Ambulatory Visit: Payer: Medicaid Other | Admitting: Obstetrics

## 2016-12-16 ENCOUNTER — Encounter (HOSPITAL_COMMUNITY): Payer: Self-pay | Admitting: *Deleted

## 2016-12-16 ENCOUNTER — Inpatient Hospital Stay (HOSPITAL_COMMUNITY)
Admission: AD | Admit: 2016-12-16 | Discharge: 2016-12-16 | Disposition: A | Discharge status: Home / Self Care | Payer: Medicaid Other | Source: Ambulatory Visit | Attending: Obstetrics & Gynecology | Admitting: Obstetrics & Gynecology

## 2016-12-16 DIAGNOSIS — B9689 Other specified bacterial agents as the cause of diseases classified elsewhere: Secondary | ICD-10-CM

## 2016-12-16 DIAGNOSIS — N898 Other specified noninflammatory disorders of vagina: Secondary | ICD-10-CM | POA: Diagnosis present

## 2016-12-16 DIAGNOSIS — N926 Irregular menstruation, unspecified: Secondary | ICD-10-CM | POA: Insufficient documentation

## 2016-12-16 DIAGNOSIS — N760 Acute vaginitis: Secondary | ICD-10-CM

## 2016-12-16 LAB — WET PREP, GENITAL
Sperm: NONE SEEN
Trich, Wet Prep: NONE SEEN
Yeast Wet Prep HPF POC: NONE SEEN

## 2016-12-16 LAB — URINALYSIS, ROUTINE W REFLEX MICROSCOPIC
BILIRUBIN URINE: NEGATIVE
GLUCOSE, UA: NEGATIVE mg/dL
HGB URINE DIPSTICK: NEGATIVE
Ketones, ur: NEGATIVE mg/dL
NITRITE: NEGATIVE
Protein, ur: NEGATIVE mg/dL
Specific Gravity, Urine: 1.015 (ref 1.005–1.030)
pH: 6 (ref 5.0–8.0)

## 2016-12-16 LAB — POCT PREGNANCY, URINE: Preg Test, Ur: NEGATIVE

## 2016-12-16 MED ORDER — METRONIDAZOLE 500 MG PO TABS: 500.0000 mg | ORAL_TABLET | Freq: Two times a day (BID) | ORAL | 0 refills | Status: AC

## 2016-12-16 NOTE — Discharge Instructions (Signed)

## 2016-12-16 NOTE — MAU Provider Note (Signed)
History   Angela Riley is a 26 yo female not pregnant in with itchy vag discharge for apx 1 wk. Also states discharge has a odor. States her periods are always irreg but does not think she is pregnant. Does not desire HIV or RPR testing.  CSN: [id number]  Arrival date & time 12/16/16  [id number]   None     Chief Complaint  Patient presents with  . Vaginal Discharge  . vaginal odor  . Possible Pregnancy    HPI  Past Medical History:  Diagnosis Date  . Anxiety   . Asthma   . Chlamydia 01/2010  . GERD (gastroesophageal reflux disease)   . Ovarian cyst     Past Surgical History:  Procedure Laterality Date  . DILATION AND CURETTAGE OF UTERUS    . INDUCED ABORTION     x2    Family History  Problem Relation Age of Onset  . Anesthesia problems Neg Hx   . Other Neg Hx     Social History   Tobacco Use  . Smoking status: Never Smoker  . Smokeless tobacco: Never Used  Substance Use Topics  . Alcohol use: No    Alcohol/week: 0.0 oz  . Drug use: No    OB History    Gravida Para Term Preterm AB Living   4 1 1  0 2 1   SAB TAB Ectopic Multiple Live Births   1 1 0 0 1      Review of Systems  Constitutional: Negative.   HENT: Negative.   Eyes: Negative.   Respiratory: Negative.   Cardiovascular: Negative.   Gastrointestinal: Negative.   Endocrine: Negative.   Genitourinary: Positive for vaginal discharge.  Musculoskeletal: Negative.   Skin: Negative.   Allergic/Immunologic: Negative.   Neurological: Negative.   Hematological: Negative.   Psychiatric/Behavioral: Negative.     Allergies  Patient has no known allergies.  Home Medications    BP 109/60   Pulse 78   Temp 97.9 F (36.6 C)   Resp 18   Wt 162 lb (73.5 kg)   LMP 11/09/2016   Breastfeeding? Unknown   BMI 26.96 kg/m   Physical Exam  Constitutional: She is oriented to person, place, and time. She appears well-developed and well-nourished.  HENT:  Head: Normocephalic.  Eyes: Pupils are equal,  round, and reactive to light.  Neck: Normal range of motion.  Cardiovascular: Normal rate, regular rhythm, normal heart sounds and intact distal pulses.  Pulmonary/Chest: Effort normal and breath sounds normal.  Abdominal: Soft. Bowel sounds are normal.  Genitourinary: Vaginal discharge found.  Musculoskeletal: Normal range of motion.  Neurological: She is alert and oriented to person, place, and time. She has normal reflexes.  Skin: Skin is warm and dry.  Psychiatric: She has a normal mood and affect. Her behavior is normal. Judgment and thought content normal.    MAU Course  Procedures (including critical care time)  Labs Reviewed  WET PREP, GENITAL  URINALYSIS, ROUTINE W REFLEX MICROSCOPIC  POCT PREGNANCY, URINE  GC/CHLAMYDIA PROBE AMP ([place]) NOT AT [facility]   No results found.   1. Vaginal discharge   2. Irregular menstrual cycle       MDM  VSS, exam reveals grayish malodorous  vag discharge. Wet prep pos clue. Will trat for BV and d/c home.

## 2016-12-16 NOTE — MAU Note (Signed)
Pt presents to MAU with complaints of lower abdominal cramping, vaginal discharge and odor for a week. Denies any VB

## 2016-12-18 LAB — GC/CHLAMYDIA PROBE AMP (~~LOC~~) NOT AT ARMC
CHLAMYDIA, DNA PROBE: NEGATIVE
Neisseria Gonorrhea: NEGATIVE

## 2017-08-15 IMAGING — US US OB COMP LESS 14 WK
1 series · 15 of 28 positions shown · non-contrast
Comparison: None.

CLINICAL DATA: Maternal fever and vomiting with cramping.
Quantitative beta HCG [DATE]. Gestational age by last menstrual
period 6 weeks 3 days.

EXAM:
OBSTETRIC <14 WK US AND TRANSVAGINAL OB US
TECHNIQUE: Both transabdominal and transvaginal ultrasound examinations were
performed for complete evaluation of the gestation as well as the
maternal uterus, adnexal regions, and pelvic cul-de-sac.
Transvaginal technique was performed to assess early pregnancy.

[Series 1: us ob comp less 14 wk · 15 of 40 slices shown]
[im 1/40]
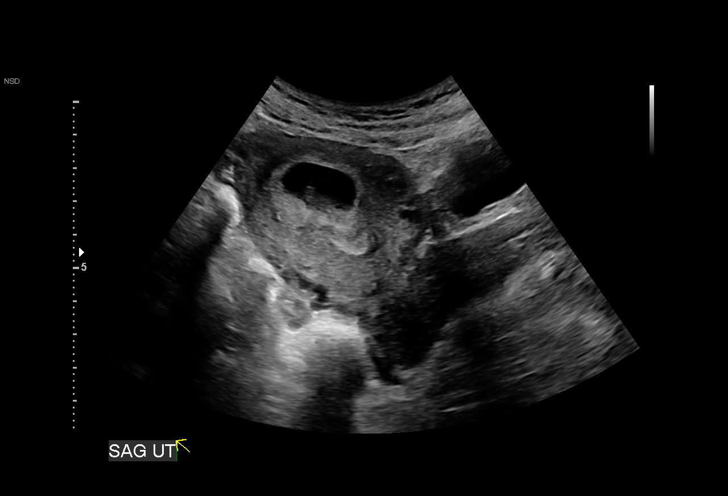
[im 3/40]
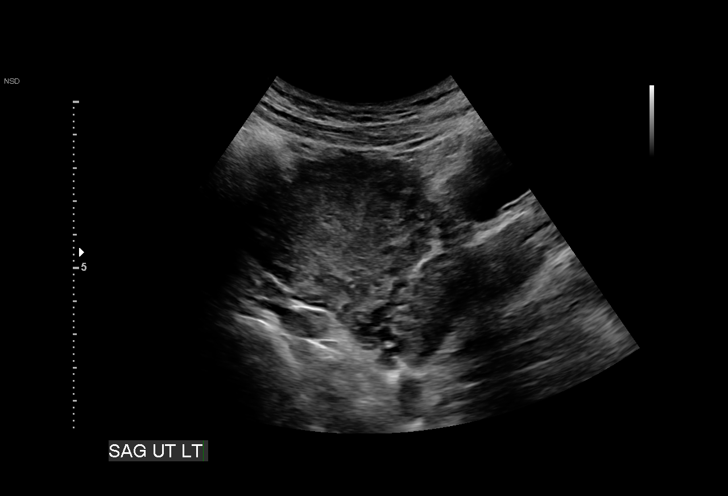
[im 6/40]
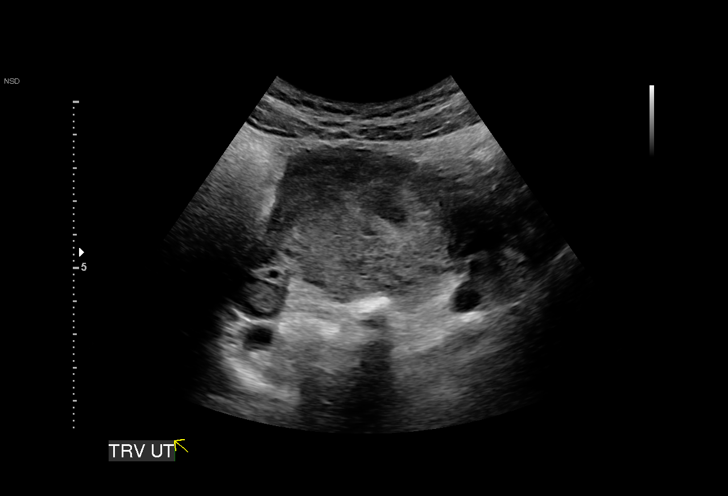
[im 9/40]
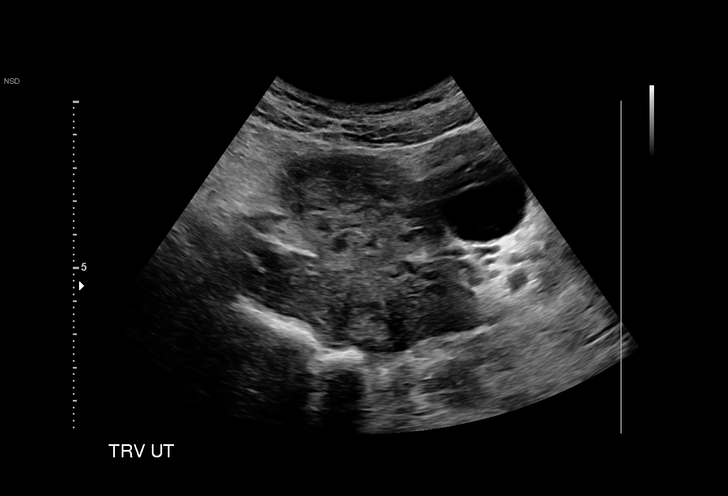
[im 12/40]
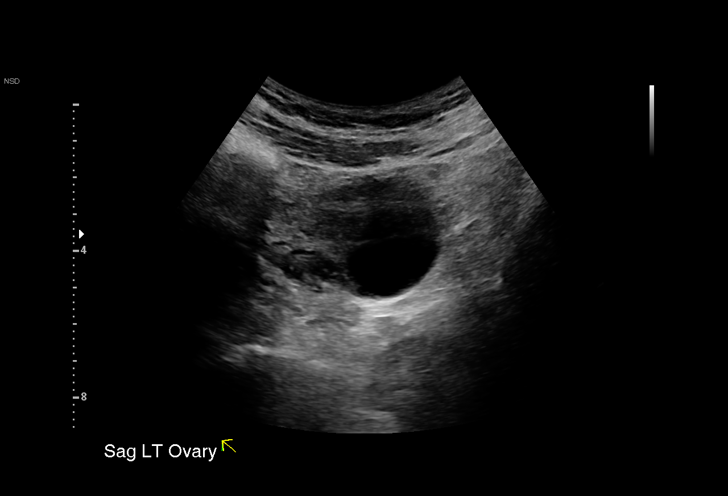
[im 15/40]
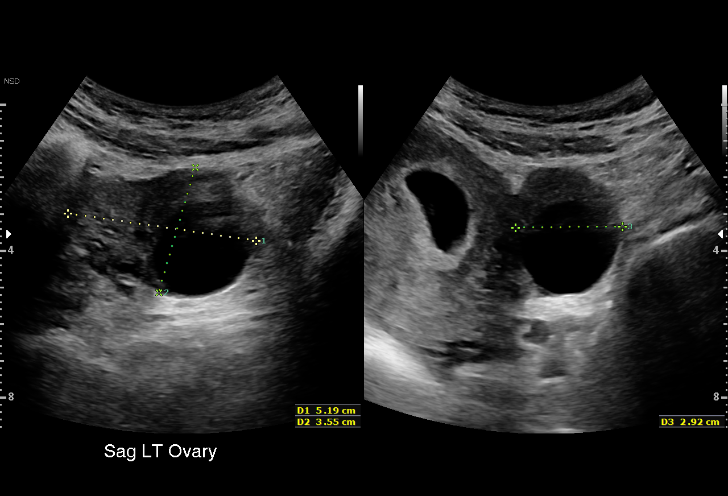
[im 18/40]
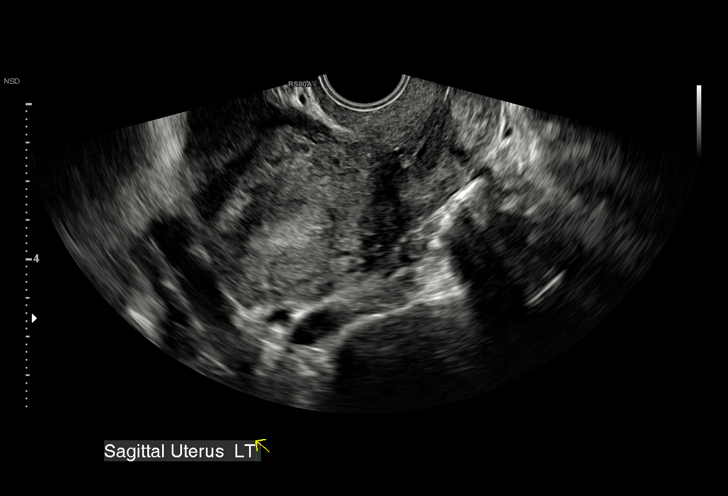
[im 21/40]
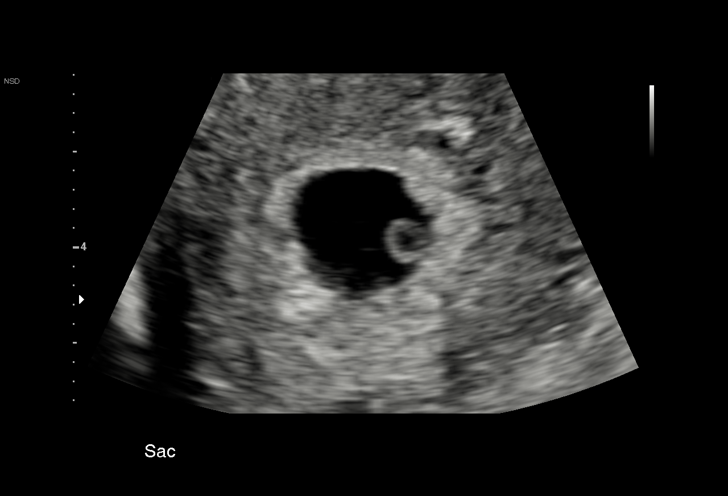
[im 22/40]
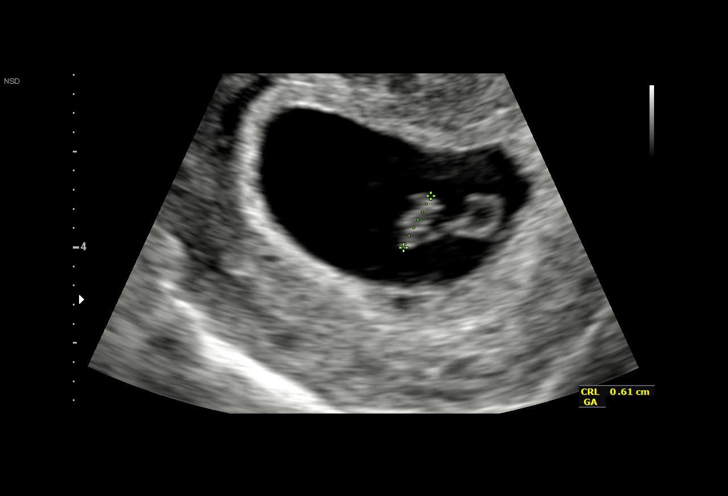
[im 25/40]
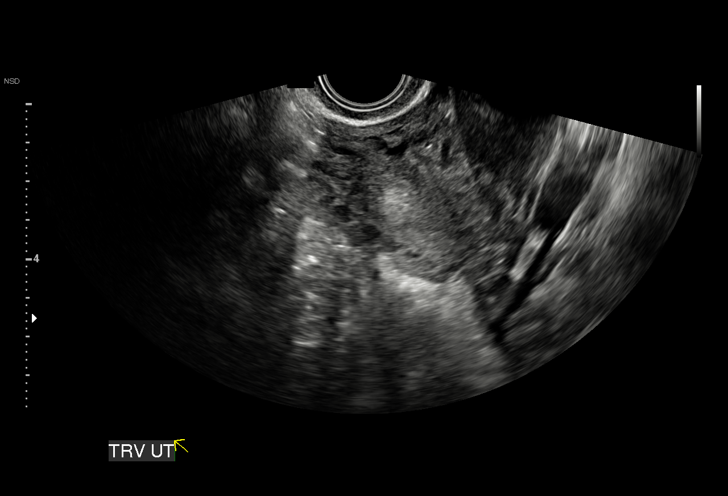
[im 28/40]
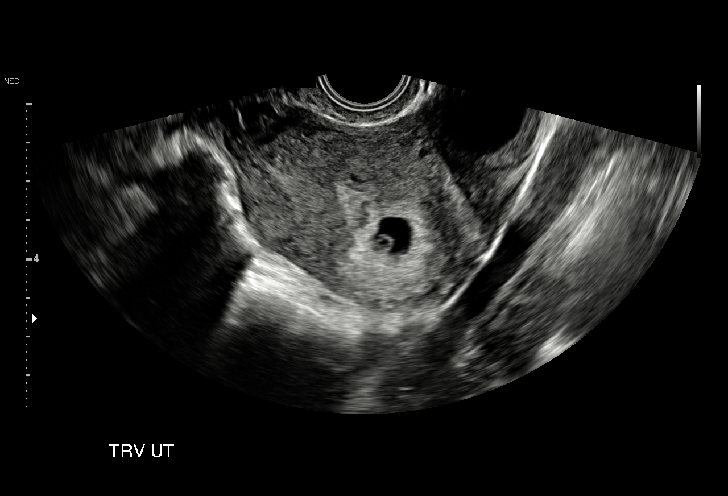
[im 31/40]
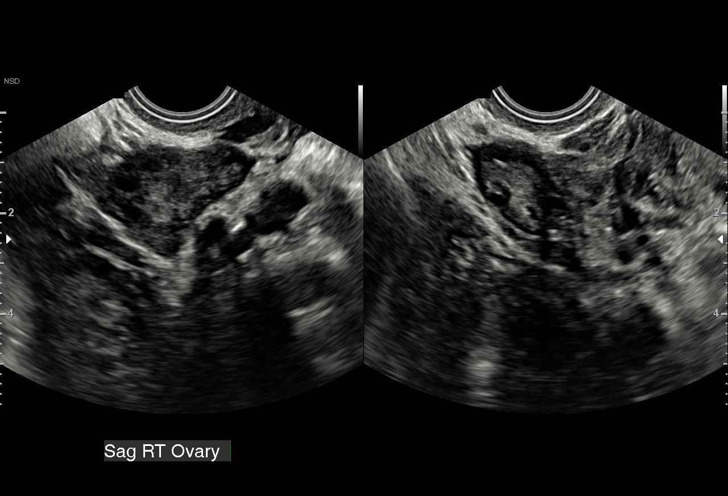
[im 34/40]
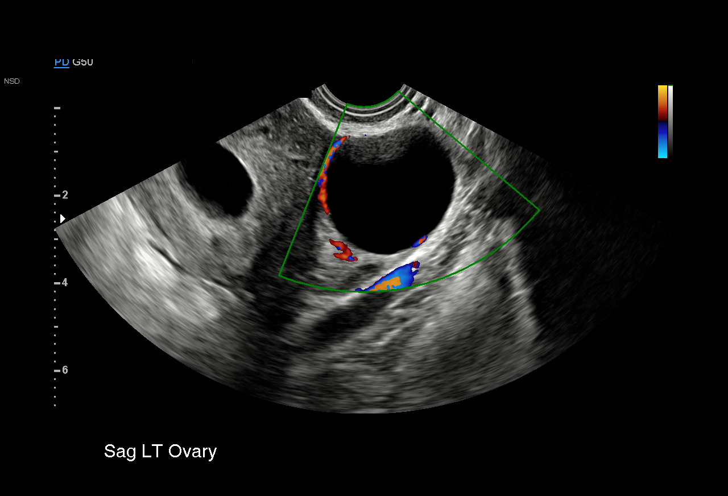
[im 37/40]
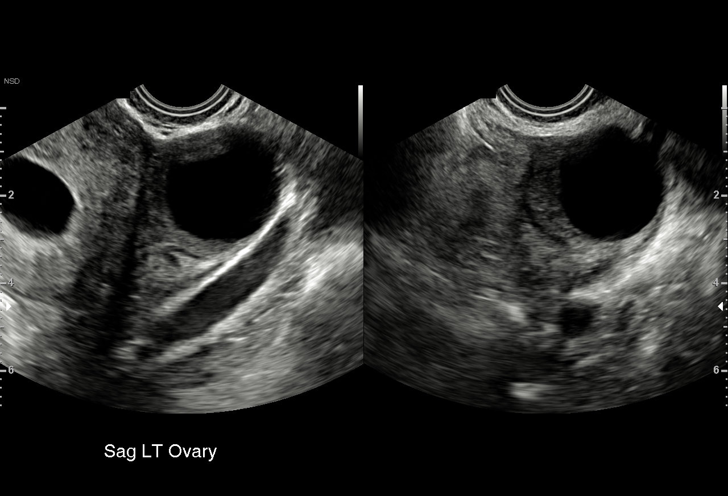
[im 40/40]
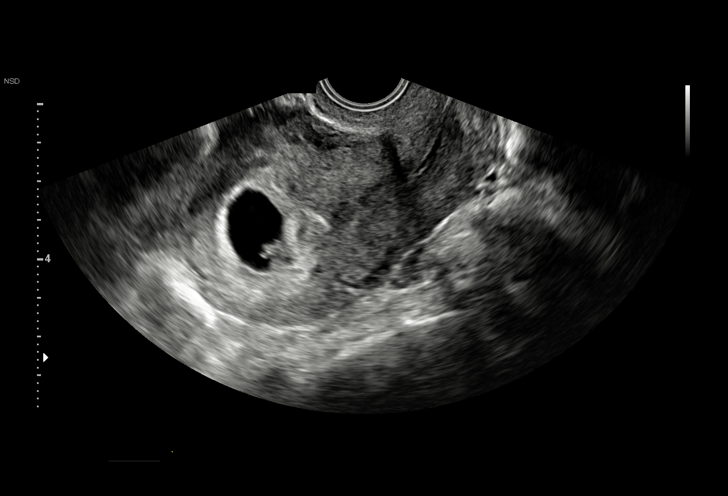

[15 of 28 positions shown; findings below may reference images not displayed]

FINDINGS: Intrauterine gestational sac: Present, single

Yolk sac:  Present

Embryo:  Present

Cardiac Activity: Present

Heart Rate: 124  bpm

CRL:  6  mm   6 w   2 d                  US EDC: 12/15/2016

Subchorionic hemorrhage:  None visualized.

Maternal uterus/adnexae: Left corpus luteum noted. Trace free pelvic
fluid.
IMPRESSION: 1. Single living intrauterine pregnancy measuring at 6 weeks 2 days
gestation. No visible complicating feature.

## 2018-01-16 ENCOUNTER — Encounter (HOSPITAL_COMMUNITY): Payer: Self-pay | Admitting: Emergency Medicine

## 2018-01-16 ENCOUNTER — Emergency Department (HOSPITAL_COMMUNITY)
Admission: EM | Admit: 2018-01-16 | Discharge: 2018-01-16 | Disposition: A | Discharge status: Home / Self Care | Payer: Medicaid Other | Attending: Emergency Medicine | Admitting: Emergency Medicine

## 2018-01-16 ENCOUNTER — Other Ambulatory Visit: Payer: Self-pay

## 2018-01-16 DIAGNOSIS — J45909 Unspecified asthma, uncomplicated: Secondary | ICD-10-CM | POA: Diagnosis not present

## 2018-01-16 DIAGNOSIS — R350 Frequency of micturition: Secondary | ICD-10-CM

## 2018-01-16 LAB — URINALYSIS, ROUTINE W REFLEX MICROSCOPIC
Bilirubin Urine: NEGATIVE
Glucose, UA: NEGATIVE mg/dL
Hgb urine dipstick: NEGATIVE
Ketones, ur: NEGATIVE mg/dL
Leukocytes, UA: NEGATIVE
Nitrite: NEGATIVE
Protein, ur: NEGATIVE mg/dL
Specific Gravity, Urine: 1.021 (ref 1.005–1.030)
pH: 7 (ref 5.0–8.0)

## 2018-01-16 MED ORDER — PHENAZOPYRIDINE HCL 95 MG PO TABS: 95.0000 mg | ORAL_TABLET | Freq: Three times a day (TID) | ORAL | 0 refills | Status: AC | PRN

## 2018-01-16 NOTE — ED Triage Notes (Signed)
Pt reports urinary frequency and odor that started about two days ago with nausea. Denies dysuria or hematuria. Denies vaginal discharge or bleeding. Denies pain. Pt reports this is similar to previous UTI.

## 2018-01-16 NOTE — ED Notes (Signed)
Urine culture tube sent down with urine sample.   

## 2018-01-16 NOTE — ED Provider Notes (Signed)
[facility] EMERGENCY [facility] Provider Note   CSN: [id number] Arrival date & time: 01/16/18  [id number]     History   Chief Complaint Chief Complaint  Patient presents with  . Urinary Frequency    HPI Angela Riley is a 27 y.o. female who presents to ED for 2-day history of urinary frequency, odor and mild discomfort.  States that she had a UTI in the past and this feels similar.  Denies any back pain, fever, vaginal discharge, abdominal pain, abnormal bleeding.  She denies possibility of pregnancy as she is on a Depo shot.  HPI  Past Medical History:  Diagnosis Date  . Anxiety   . Asthma   . Chlamydia 01/2010  . GERD (gastroesophageal reflux disease)   . Ovarian cyst     Patient Active Problem List   Diagnosis Date Noted  . ASCUS of cervix with negative high risk HPV 05/23/2016  . Obese 05/17/2016  . Supervision of normal pregnancy, antepartum 05/15/2016  . Antepartum bleeding, first trimester 11/11/2015    Past Surgical History:  Procedure Laterality Date  . DILATION AND CURETTAGE OF UTERUS    . DILATION AND EVACUATION  07/25/2011   Procedure: DILATATION AND EVACUATION;  Surgeon: Leslie AndreaJames E Tomblin II, MD;  Location: [facility] ORS;  Service: Gynecology;  Laterality: N/A;  . INDUCED ABORTION     x2     OB History    Gravida  4   Para  1   Term  1   Preterm  0   AB  2   Living  1     SAB  1   TAB  1   Ectopic  0   Multiple  0   Live Births  1            Home Medications    Prior to Admission medications   Medication Sig Start Date End Date Taking? Authorizing Provider  metroNIDAZOLE (FLAGYL) 500 MG tablet Take 1 tablet (500 mg total) 2 (two) times daily by mouth. 12/16/16   Montez MoritaLawson, Marie D, CNM  phenazopyridine (PYRIDIUM) 95 MG tablet Take 1 tablet (95 mg total) by mouth 3 (three) times daily as needed for pain. 01/16/18   Dietrich PatesKhatri, Camella Seim, PA-C    Family History Family History  Problem Relation Age of Onset  . Anesthesia  problems Neg Hx   . Other Neg Hx     Social History Social History   Tobacco Use  . Smoking status: Never Smoker  . Smokeless tobacco: Never Used  Substance Use Topics  . Alcohol use: No    Alcohol/week: 0.0 standard drinks  . Drug use: No     Allergies   Patient has no known allergies.   Review of Systems Review of Systems  Constitutional: Negative for appetite change, chills and fever.  HENT: Negative for ear pain, rhinorrhea, sneezing and sore throat.   Eyes: Negative for photophobia and visual disturbance.  Respiratory: Negative for cough, chest tightness, shortness of breath and wheezing.   Cardiovascular: Negative for chest pain and palpitations.  Gastrointestinal: Negative for abdominal pain, blood in stool, constipation, diarrhea, nausea and vomiting.  Genitourinary: Positive for dysuria and frequency. Negative for hematuria and urgency.  Musculoskeletal: Negative for myalgias.  Skin: Negative for rash.  Neurological: Negative for dizziness, weakness and light-headedness.     Physical Exam Updated Vital Signs BP 117/78 (BP Location: Right Arm)   Pulse 78   Temp 98.7 F (37.1 C) (Oral)   Resp 14  Ht 5\' 5"  (1.651 m)   Wt 72.1 kg   LMP 01/13/2018   SpO2 100%   BMI 26.46 kg/m   Physical Exam  Constitutional: She appears well-developed and well-nourished. No distress.  HENT:  Head: Normocephalic and atraumatic.  Eyes: Conjunctivae and EOM are normal. No scleral icterus.  Neck: Normal range of motion.  Pulmonary/Chest: Effort normal. No respiratory distress.  Abdominal: Soft. Bowel sounds are normal. There is no tenderness.  No CVA tenderness noted.  Neurological: She is alert.  Skin: No rash noted. She is not diaphoretic.  Psychiatric: She has a normal mood and affect.  Nursing note and vitals reviewed.    ED Treatments / Results  Labs (all labs ordered are listed, but only abnormal results are displayed) Labs Reviewed  URINALYSIS, ROUTINE W  REFLEX MICROSCOPIC - Abnormal; Notable for the following components:      Result Value   APPearance HAZY (*)    All other components within normal limits  POC URINE PREG, ED    EKG None  Radiology No results found.  Procedures Procedures (including critical care time)  Medications Ordered in ED Medications - No data to display   Initial Impression / Assessment and Plan / ED Course  I have reviewed the triage vital signs and the nursing notes.  Pertinent labs & imaging results that were available during my care of the patient were reviewed by me and considered in my medical decision making (see chart for details).     27 year old female presents to ED for urinary frequency, mild discomfort and foul odor for the past 2 days.  She had UTI once and states that this feels similar.  Denies any vaginal complaints, fever, back pain, abdominal pain.  On exam she is overall well-appearing.  Urinalysis with no evidence of UTI.  Point-of-care urine pregnancy test is pending.  The lab did not receive her urine sample and are unable to run this.  I told patient that a pregnancy test was not completed.  However, she states that she is not concerned for pregnancy and does not want to stay for a pregnancy test to be completed as she is on a Depo-Provera shot.  She denies any vaginal discharge and declines the need for pelvic exam.  I feel that this is reasonable.  We will give her Azo to help with her symptoms and advised her to return to ED for any severe worsening symptoms.  Patient is hemodynamically stable, in NAD, and able to ambulate in the ED. Evaluation does not show pathology that would require ongoing emergent intervention or inpatient treatment. I explained the diagnosis to the patient. Pain has been managed and has no complaints prior to discharge. Patient is comfortable with above plan and is stable for discharge at this time. All questions were answered prior to disposition. Strict return  precautions for returning to the ED were discussed. Encouraged follow up with PCP.    Portions of this note were generated with Scientist, clinical (histocompatibility and immunogenetics). Dictation errors may occur despite best attempts at proofreading.  Final Clinical Impressions(s) / ED Diagnoses   Final diagnoses:  Urinary frequency    ED Discharge Orders         Ordered    phenazopyridine (PYRIDIUM) 95 MG tablet  3 times daily PRN     01/16/18 2027           Dietrich Pates, PA-C 01/16/18 2030    Alvira Monday, MD 01/17/18 1141

## 2018-01-16 NOTE — Discharge Instructions (Signed)
Return to ED if your symptoms start to worsen, you start having fevers or abdominal pain or back pain, abnormal bleeding.
